# Patient Record
Sex: Female | Born: 2000 | Race: White | Hispanic: No | Marital: Single | Smoking: Never smoker
Health system: Southern US, Community
[De-identification: ages and names within clinical notes are randomized; demographics above are authoritative.]

## PROBLEM LIST (undated history)

## (undated) DIAGNOSIS — G4700 Insomnia, unspecified: Secondary | ICD-10-CM

## (undated) DIAGNOSIS — F909 Attention-deficit hyperactivity disorder, unspecified type: Secondary | ICD-10-CM

## (undated) HISTORY — PX: OTHER SURGICAL HISTORY: SHX169

---
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
## 2018-09-17 NOTE — L&D Delivery Note (Addendum)
[facility]N [facility] Delivery Note  Helen Sims is a 18 y.o. G1P0 s/p vaginal Delivery at [redacted]w[redacted]d. She was admitted for IOL for gHTN.   ROM: 14h 41m with fluid GBS Status: GBS positive, PCN Maximum Maternal Temperature: 99.7  Labor Progress: . Achieved complete cervical dilation at 1542 after Pitocin augmentation.  No further augmentation required and progressed to vaginal delivery.    Delivery Date/Time: 08/11/2019@ 1633 Delivery: Called to room and patient was complete and pushing. Head delivered ROA. No nuchal cord present. Shoulder and body delivered in usual fashion. Infant with spontaneous cry, placed on mother's abdomen, dried and stimulated. Short placental cord which was clamped x 2 after 1-minute delay, and cut by FOB. Cord blood drawn. Placenta delivered by manual extraction after 32 mins and sent to Pathology. Fundus firm with massage and Pitocin. Labia, perineum, vagina, and cervix inspected and noted to have Rt sulcus and Lt labial tear both repaired with 2.0 Vicryl Rapide and hemostasis achieved.   Placenta: 3 cord vessel, retained with manual extraction by Colman Cater CNM and intact sent to Pathology Complications: Retained placenta with manual extraction by Colman Cater CNM, Ancef x1 ordered Lacerations: Rt Sulcus and Lt labia QBL: 393 Analgesia: Epidural  Postpartum Planning [x]  message to sent to schedule follow-up  [x]  vaccines UTD  Infant: female  APGARs 8/9  [place] for [place], [place] 08/11/2019, 5:30 PM  Midwife attestation: I was gloved and present for delivery in its entirety and I agree with the above resident's note.  Julianne Handler, CNM 7:37 PM

## 2018-12-15 ENCOUNTER — Telehealth: Payer: Self-pay | Admitting: *Deleted

## 2018-12-15 NOTE — Telephone Encounter (Signed)
Pt states that she has had 5 home pregnancy tests. Her LMP was 11/15/18 which puts her at 4+2. Advised that she would have a televisit with a provider, and that it would be billed to her insurance. Advised that the provider would let her know the plan of action after that visit and schedule a dating u/s. Pt verbalized understanding.

## 2018-12-15 NOTE — Telephone Encounter (Signed)
Patient's mother called wanting to schedule patient an appointment for pregnancy. Mom states the patient has took 5 home test and they are all positive. Please advise [phone]

## 2018-12-23 ENCOUNTER — Encounter: Payer: Medicaid Other | Admitting: Adult Health

## 2019-01-28 ENCOUNTER — Encounter: Payer: Self-pay | Admitting: Adult Health

## 2019-01-28 ENCOUNTER — Ambulatory Visit (INDEPENDENT_AMBULATORY_CARE_PROVIDER_SITE_OTHER): Payer: 59 | Admitting: Adult Health

## 2019-01-28 ENCOUNTER — Other Ambulatory Visit: Payer: Self-pay

## 2019-01-28 DIAGNOSIS — O3680X0 Pregnancy with inconclusive fetal viability, not applicable or unspecified: Secondary | ICD-10-CM | POA: Insufficient documentation

## 2019-01-28 DIAGNOSIS — Z3201 Encounter for pregnancy test, result positive: Secondary | ICD-10-CM | POA: Insufficient documentation

## 2019-01-28 DIAGNOSIS — Z3A10 10 weeks gestation of pregnancy: Secondary | ICD-10-CM

## 2019-01-28 MED ORDER — PRENATAL VITAMIN 27-0.8 MG PO TABS: 1.0000 | ORAL_TABLET | Freq: Every day | ORAL | 12 refills | Status: DC

## 2019-01-28 NOTE — Progress Notes (Signed)
Patient ID: Helen Sims, female   DOB: 22-Apr-2001, 18 y.o.   MRN: [id number]   TELEHEALTH VIRTUAL GYNECOLOGY VISIT ENCOUNTER NOTE  I connected with Helen Sims on 01/28/19 at 12:00 PM EDT by telephone at home and verified that I am speaking with the correct person using two identifiers.   I discussed the limitations, risks, security and privacy concerns of performing an evaluation and management service by telephone and the availability of in person appointments. I also discussed with the patient that there may be a patient responsible charge related to this service. The patient expressed understanding and agreed to proceed.   History:  Helen Sims is a 18 y.o. G1P0 white female,single, being evaluated today for having missed 2 periods and had 5+HPTs.she is about 10+4 weeks by LMP 11/15/18, with EDD 08/22/2019.She has had breat tenderness, moody, sleeping and pee more often. She denies any abnormal vaginal discharge, bleeding, pelvic pain,nausea, vomiting or other concerns.    PCP is Dr Helen Sims.    Past Medical History:  Diagnosis Date  . ADHD (attention deficit hyperactivity disorder)   . Insomnia    Past Surgical History:  Procedure Laterality Date  . tubes in ears     The following portions of the patient's history were reviewed and updated as appropriate: allergies, current medications, past family history, past medical history, past social history, past surgical history and problem list.   Health Maintenance: pap at 19  Review of Systems:  Pertinent items noted in HPI and remainder of comprehensive ROS otherwise negative.  Physical Exam:   General:  Alert, oriented and cooperative.   Mental Status: Normal mood and affect perceived. Normal judgment and thought content.  Physical exam deferred due to nature of the encounter  Labs and Imaging No results found for this or any previous visit (from the past 336 hour(s)). No results found.    Assessment and Plan:     1.  Positive pregnancy test -5HPTs  2. [redacted] weeks gestation of pregnancy Will rx PNV and schedule dating [place] in 1 week  Meds ordered this encounter  Medications  . Prenatal Vit-Fe Fumarate-FA (PRENATAL VITAMIN) 27-0.8 MG TABS    Sig: Take 1 tablet by mouth daily.    Dispense:  30 tablet    Refill:  12    Order Specific Question:   Supervising Provider    Answer:   Despina Hidden, LUTHER H [[id number]]    3. Encounter to determine fetal viability of pregnancy, single or unspecified fetus - US OB Comp Less 14 Wks; Future       I discussed the assessment and treatment plan with the patient. The patient was provided an opportunity to ask questions and all were answered. The patient agreed with the plan and demonstrated an understanding of the instructions.   The patient was advised to call back or seek an in-person evaluation/go to the ED if the symptoms worsen or if the condition fails to improve as anticipated.  I provided  7 minutes of non-face-to-face time during this encounter.   Cyril Mourning, NP [facility] [employer]

## 2019-01-31 DIAGNOSIS — Z87891 Personal history of nicotine dependence: Secondary | ICD-10-CM | POA: Diagnosis not present

## 2019-01-31 DIAGNOSIS — O209 Hemorrhage in early pregnancy, unspecified: Secondary | ICD-10-CM | POA: Diagnosis not present

## 2019-01-31 DIAGNOSIS — Z3A10 10 weeks gestation of pregnancy: Secondary | ICD-10-CM | POA: Diagnosis not present

## 2019-02-04 ENCOUNTER — Ambulatory Visit (INDEPENDENT_AMBULATORY_CARE_PROVIDER_SITE_OTHER): Payer: 59

## 2019-02-04 ENCOUNTER — Other Ambulatory Visit: Payer: Self-pay

## 2019-02-04 DIAGNOSIS — O3680X0 Pregnancy with inconclusive fetal viability, not applicable or unspecified: Secondary | ICD-10-CM

## 2019-02-04 DIAGNOSIS — Z3A11 11 weeks gestation of pregnancy: Secondary | ICD-10-CM

## 2019-02-04 NOTE — Progress Notes (Signed)
[place] 11+4 wks,single IUP,CRL 51.44 mm,fhr 166 bpm,anterior placenta gr 0,normal ovaries bilat

## 2019-02-13 ENCOUNTER — Other Ambulatory Visit: Payer: Self-pay | Admitting: Obstetrics and Gynecology

## 2019-02-13 DIAGNOSIS — Z3682 Encounter for antenatal screening for nuchal translucency: Secondary | ICD-10-CM

## 2019-02-16 ENCOUNTER — Telehealth: Payer: Self-pay | Admitting: *Deleted

## 2019-02-16 NOTE — Telephone Encounter (Signed)
Patient informed we are still not allowing any visitors or children to come in during appointment time unless physical assistance is needed. Asked if has had any exposure to anyone suspected or confirmed of having COVID-19 or if she was experiencing any of the following, to reschedule: fever, cough, shortness of breath, muscle pain, diarrhea, rash, vomiting, abdominal pain, red eye, weakness, bruising, bleeding, joint pain, or a severe headache.  Stated no to all.  Asked that she complete E-check-in via mychart prior to arrival.  Advised to check-in via Hello Patient and call our office on arrival in our office parking lot to complete registration over the phone. Advised to also use the provided hand sanitizer when entering the office and to wear a mask if they have one of their own, if not, we are happy to provide one. Pt verbalized understanding.      

## 2019-02-17 ENCOUNTER — Other Ambulatory Visit: Payer: 59

## 2019-02-17 ENCOUNTER — Ambulatory Visit: Payer: 59 | Admitting: *Deleted

## 2019-02-17 ENCOUNTER — Encounter: Payer: 59 | Admitting: Women's Health

## 2019-03-09 ENCOUNTER — Telehealth: Payer: Self-pay | Admitting: Women's Health

## 2019-03-09 NOTE — Telephone Encounter (Signed)

## 2019-03-11 ENCOUNTER — Ambulatory Visit: Payer: 59 | Admitting: *Deleted

## 2019-03-11 ENCOUNTER — Ambulatory Visit (INDEPENDENT_AMBULATORY_CARE_PROVIDER_SITE_OTHER): Payer: 59 | Admitting: Women's Health

## 2019-03-11 ENCOUNTER — Encounter: Payer: Self-pay | Admitting: Women's Health

## 2019-03-11 ENCOUNTER — Other Ambulatory Visit: Payer: 59

## 2019-03-11 ENCOUNTER — Other Ambulatory Visit: Payer: Self-pay

## 2019-03-11 VITALS — BP 126/74 | HR 98 | Wt 220.0 lb

## 2019-03-11 DIAGNOSIS — Z3402 Encounter for supervision of normal first pregnancy, second trimester: Secondary | ICD-10-CM | POA: Diagnosis not present

## 2019-03-11 DIAGNOSIS — Z3400 Encounter for supervision of normal first pregnancy, unspecified trimester: Secondary | ICD-10-CM | POA: Insufficient documentation

## 2019-03-11 DIAGNOSIS — Z3A16 16 weeks gestation of pregnancy: Secondary | ICD-10-CM | POA: Diagnosis not present

## 2019-03-11 DIAGNOSIS — Z1389 Encounter for screening for other disorder: Secondary | ICD-10-CM

## 2019-03-11 DIAGNOSIS — Z363 Encounter for antenatal screening for malformations: Secondary | ICD-10-CM

## 2019-03-11 DIAGNOSIS — Z331 Pregnant state, incidental: Secondary | ICD-10-CM

## 2019-03-11 LAB — POCT URINALYSIS DIPSTICK OB
Blood, UA: NEGATIVE
Glucose, UA: NEGATIVE
Ketones, UA: NEGATIVE
Leukocytes, UA: NEGATIVE
Nitrite, UA: NEGATIVE
POC,PROTEIN,UA: NEGATIVE

## 2019-03-11 MED ORDER — BLOOD PRESSURE MONITOR MISC: 0 refills | Status: DC

## 2019-03-11 NOTE — Patient Instructions (Signed)
Helen Sims, I greatly value your feedback.  If you receive a survey following your visit with [place] today, we appreciate you taking the time to fill it out.  Thanks, Knute Neu, CNM, [facility]  [place]!!! It is now [place] at [facility] ([place], [place] [address]) Entrance located off of [address] parking   Nausea & Vomiting  Have saltine crackers or pretzels by your bed and eat a few bites before you raise your head out of bed in the morning  Eat small frequent meals throughout the day instead of large meals  Drink plenty of fluids throughout the day to stay hydrated, just don't drink a lot of fluids with your meals.  This can make your stomach fill up faster making you feel sick  Do not brush your teeth right after you eat  Products with real ginger are good for nausea, like ginger ale and ginger hard candy Make sure it says made with real ginger!  Sucking on sour candy like lemon heads is also good for nausea  If your prenatal vitamins make you nauseated, take them at night so you will sleep through the nausea  Sea Bands  If you feel like you need medicine for the nausea & vomiting please let us know  If you are unable to keep any fluids or food down please let us know   Constipation  Drink plenty of fluid, preferably water, throughout the day  Eat foods high in fiber such as fruits, vegetables, and grains  Exercise, such as walking, is a good way to keep your bowels regular  Drink warm fluids, especially warm prune juice, or decaf coffee  Eat a 1/2 cup of real oatmeal (not instant), 1/2 cup applesauce, and 1/2-1 cup warm prune juice every day  If needed, you may take Colace (docusate sodium) stool softener once or twice a day to help keep the stool soft.   If you still are having problems with constipation, you may take Miralax once daily as needed to help keep your bowels regular.   Home Blood  Pressure Monitoring for Patients   Your provider has recommended that you check your blood pressure (BP) at least once a week at home. If you do not have a blood pressure cuff at home, one will be provided for you. Contact your provider if you have not received your monitor within 1 week.   Helpful Tips for Accurate Home Blood Pressure Checks  . Don't smoke, exercise, or drink caffeine 30 minutes before checking your BP . Use the restroom before checking your BP (a full bladder can raise your pressure) . Relax in a comfortable upright chair . Feet on the ground . Left arm resting comfortably on a flat surface at the level of your heart . Legs uncrossed . Back supported . Sit quietly and don't talk . Place the cuff on your bare arm . Adjust snuggly, so that only two fingertips can fit between your skin and the top of the cuff . Check 2 readings separated by at least one minute . Keep a log of your BP readings . For a visual, please reference this diagram: http://ccnc.care/bpdiagram  Provider Name: [facility]     Phone: [phone]  Zone 1: ALL CLEAR  Continue to monitor your symptoms:  . BP reading is less than 140 (top number) or less than 90 (bottom number)  . No right upper stomach  pain . No headaches or seeing spots . No feeling nauseated or throwing up . No swelling in face and hands  Zone 2: CAUTION Call your doctor's office for any of the following:  . BP reading is greater than 140 (top number) or greater than 90 (bottom number)  . Stomach pain under your ribs in the middle or right side . Headaches or seeing spots . Feeling nauseated or throwing up . Swelling in face and hands  Zone 3: EMERGENCY  Seek immediate medical care if you have any of the following:  . BP reading is greater than160 (top number) or greater than 110 (bottom number) . Severe headaches not improving with Tylenol . Serious difficulty catching your breath . Any worsening symptoms from Zone  2    Second Trimester of Pregnancy The second trimester is from week 14 through week 27 (months 4 through 6). The second trimester is often a time when you feel your best. Your body has adjusted to being pregnant, and you begin to feel better physically. Usually, morning sickness has lessened or quit completely, you may have more energy, and you may have an increase in appetite. The second trimester is also a time when the fetus is growing rapidly. At the end of the sixth month, the fetus is about 9 inches long and weighs about 1 pounds. You will likely begin to feel the baby move (quickening) between 16 and 20 weeks of pregnancy. Body changes during your second trimester Your body continues to go through many changes during your second trimester. The changes vary from woman to woman.  Your weight will continue to increase. You will notice your lower abdomen bulging out.  You may begin to get stretch marks on your hips, abdomen, and breasts.  You may develop headaches that can be relieved by medicines. The medicines should be approved by your health care provider.  You may urinate more often because the fetus is pressing on your bladder.  You may develop or continue to have heartburn as a result of your pregnancy.  You may develop constipation because certain hormones are causing the muscles that push waste through your intestines to slow down.  You may develop hemorrhoids or swollen, bulging veins (varicose veins).  You may have back pain. This is caused by: ? Weight gain. ? Pregnancy hormones that are relaxing the joints in your pelvis. ? A shift in weight and the muscles that support your balance.  Your breasts will continue to grow and they will continue to become tender.  Your gums may bleed and may be sensitive to brushing and flossing.  Dark spots or blotches (chloasma, mask of pregnancy) may develop on your face. This will likely fade after the baby is born.  A dark line  from your belly button to the pubic area (linea nigra) may appear. This will likely fade after the baby is born.  You may have changes in your hair. These can include thickening of your hair, rapid growth, and changes in texture. Some women also have hair loss during or after pregnancy, or hair that feels dry or thin. Your hair will most likely return to normal after your baby is born. What to expect at prenatal visits During a routine prenatal visit:  You will be weighed to make sure you and the fetus are growing normally.  Your blood pressure will be taken.  Your abdomen will be measured to track your baby's growth.  The fetal heartbeat will be listened to.  Any test results from the previous visit will be discussed. Your health care provider may ask you:  How you are feeling.  If you are feeling the baby move.  If you have had any abnormal symptoms, such as leaking fluid, bleeding, severe headaches, or abdominal cramping.  If you are using any tobacco products, including cigarettes, chewing tobacco, and electronic cigarettes.  If you have any questions. Other tests that may be performed during your second trimester include:  Blood tests that check for: ? Low iron levels (anemia). ? High blood sugar that affects pregnant women (gestational diabetes) between 2 and 28 weeks. ? Rh antibodies. This is to check for a protein on red blood cells (Rh factor).  Urine tests to check for infections, diabetes, or protein in the urine.  An ultrasound to confirm the proper growth and development of the baby.  An amniocentesis to check for possible genetic problems.  Fetal screens for spina bifida and Down syndrome.  HIV (human immunodeficiency virus) testing. Routine prenatal testing includes screening for HIV, unless you choose not to have this test. Follow these instructions at home: Medicines  Follow your health care provider's instructions regarding medicine use. Specific  medicines may be either safe or unsafe to take during pregnancy.  Take a prenatal vitamin that contains at least 600 micrograms (mcg) of folic acid.  If you develop constipation, try taking a stool softener if your health care provider approves. Eating and drinking   Eat a balanced diet that includes fresh fruits and vegetables, whole grains, good sources of protein such as meat, eggs, or tofu, and low-fat dairy. Your health care provider will help you determine the amount of weight gain that is right for you.  Avoid raw meat and uncooked cheese. These carry germs that can cause birth defects in the baby.  If you have low calcium intake from food, talk to your health care provider about whether you should take a daily calcium supplement.  Limit foods that are high in fat and processed sugars, such as fried and sweet foods.  To prevent constipation: ? Drink enough fluid to keep your urine clear or pale yellow. ? Eat foods that are high in fiber, such as fresh fruits and vegetables, whole grains, and beans. Activity  Exercise only as directed by your health care provider. Most women can continue their usual exercise routine during pregnancy. Try to exercise for 30 minutes at least 5 days a week. Stop exercising if you experience uterine contractions.  Avoid heavy lifting, wear low heel shoes, and practice good posture.  A sexual relationship may be continued unless your health care provider directs you otherwise. Relieving pain and discomfort  Wear a good support bra to prevent discomfort from breast tenderness.  Take warm sitz baths to soothe any pain or discomfort caused by hemorrhoids. Use hemorrhoid cream if your health care provider approves.  Rest with your legs elevated if you have leg cramps or low back pain.  If you develop varicose veins, wear support hose. Elevate your feet for 15 minutes, 3-4 times a day. Limit salt in your diet. Prenatal Care  Write down your  questions. Take them to your prenatal visits.  Keep all your prenatal visits as told by your health care provider. This is important. Safety  Wear your seat belt at all times when driving.  Make a list of emergency phone numbers, including numbers for family, friends, the hospital, and police and fire departments. General instructions  Ask your health  care provider for a referral to a local prenatal education class. Begin classes no later than the beginning of month 6 of your pregnancy.  Ask for help if you have counseling or nutritional needs during pregnancy. Your health care provider can offer advice or refer you to specialists for help with various needs.  Do not use hot tubs, steam rooms, or saunas.  Do not douche or use tampons or scented sanitary pads.  Do not cross your legs for long periods of time.  Avoid cat litter boxes and soil used by cats. These carry germs that can cause birth defects in the baby and possibly loss of the fetus by miscarriage or stillbirth.  Avoid all smoking, herbs, alcohol, and unprescribed drugs. Chemicals in these products can affect the formation and growth of the baby.  Do not use any products that contain nicotine or tobacco, such as cigarettes and e-cigarettes. If you need help quitting, ask your health care provider.  Visit your dentist if you have not gone yet during your pregnancy. Use a soft toothbrush to brush your teeth and be gentle when you floss. Contact a health care provider if:  You have dizziness.  You have mild pelvic cramps, pelvic pressure, or nagging pain in the abdominal area.  You have persistent nausea, vomiting, or diarrhea.  You have a bad smelling vaginal discharge.  You have pain when you urinate. Get help right away if:  You have a fever.  You are leaking fluid from your vagina.  You have spotting or bleeding from your vagina.  You have severe abdominal cramping or pain.  You have rapid weight gain or  weight loss.  You have shortness of breath with chest pain.  You notice sudden or extreme swelling of your face, hands, ankles, feet, or legs.  You have not felt your baby move in over an hour.  You have severe headaches that do not go away when you take medicine.  You have vision changes. Summary  The second trimester is from week 14 through week 27 (months 4 through 6). It is also a time when the fetus is growing rapidly.  Your body goes through many changes during pregnancy. The changes vary from woman to woman.  Avoid all smoking, herbs, alcohol, and unprescribed drugs. These chemicals affect the formation and growth your baby.  Do not use any tobacco products, such as cigarettes, chewing tobacco, and e-cigarettes. If you need help quitting, ask your health care provider.  Contact your health care provider if you have any questions. Keep all prenatal visits as told by your health care provider. This is important. This information is not intended to replace advice given to you by your health care provider. Make sure you discuss any questions you have with your health care provider. Document Released: 08/28/2001 Document Revised: 10/09/2016 Document Reviewed: 10/09/2016 Elsevier Interactive Patient Education  2019 [employer] Coronavirus (COVID-19) Are you at risk?  Are you at risk for the Coronavirus (COVID-19)?  To be considered HIGH RISK for Coronavirus (COVID-19), you have to meet the following criteria:  . Traveled to [place], [place], [place], [place] or [place]; or in the [place] to [place], [place], [place], or [place]; and have fever, cough, and shortness of breath within the last 2 weeks of travel OR . Been in close contact with a person diagnosed with COVID-19 within the last 2 weeks and have fever, cough, and shortness of breath . IF YOU DO NOT MEET THESE CRITERIA,  YOU ARE CONSIDERED LOW RISK FOR COVID-19.  What to do if you are HIGH RISK for COVID-19?   Marland Kitchen. If you are having a medical emergency, call 911. . Seek medical care right away. Before you go to a doctor's office, urgent care or emergency department, call ahead and tell them about your recent travel, contact with someone diagnosed with COVID-19, and your symptoms. You should receive instructions from your physician's office regarding next steps of care.  . When you arrive at healthcare provider, tell the healthcare staff immediately you have returned from visiting [place], [place], [place], [place] or [place]; or traveled in the [place] to [place], [place], [place], or [place]; in the last two weeks or you have been in close contact with a person diagnosed with COVID-19 in the last 2 weeks.   . Tell the health care staff about your symptoms: fever, cough and shortness of breath. . After you have been seen by a medical provider, you will be either: o Tested for (COVID-19) and discharged home on quarantine except to seek medical care if symptoms worsen, and asked to  - Stay home and avoid contact with others until you get your results (4-5 days)  - Avoid travel on public transportation if possible (such as bus, train, or airplane) or o Sent to the Emergency Department by EMS for evaluation, COVID-19 testing, and possible admission depending on your condition and test results.  What to do if you are LOW RISK for COVID-19?  Reduce your risk of any infection by using the same precautions used for avoiding the common cold or flu:  Marland Kitchen. Wash your hands often with soap and warm water for at least 20 seconds.  If soap and water are not readily available, use an alcohol-based hand sanitizer with at least 60% alcohol.  . If coughing or sneezing, cover your mouth and nose by coughing or sneezing into the elbow areas of your shirt or coat, into a tissue or into your sleeve (not your hands). . Avoid shaking hands with others and consider head nods or verbal greetings only. . Avoid touching your  eyes, nose, or mouth with unwashed hands.  . Avoid close contact with people who are sick. . Avoid places or events with large numbers of people in one location, like concerts or sporting events. . Carefully consider travel plans you have or are making. . If you are planning any travel outside or inside the [place], visit the CDC's Travelers' Health webpage for the latest health notices. . If you have some symptoms but not all symptoms, continue to monitor at home and seek medical attention if your symptoms worsen. . If you are having a medical emergency, call 911.   ADDITIONAL HEALTHCARE OPTIONS FOR PATIENTS  [place] [facility] / e-Visit: [email or web address]         [facility]: [phone]  [facility]: [phone]                   [facility]: [phone]

## 2019-03-11 NOTE — Progress Notes (Signed)
INITIAL OBSTETRICAL VISIT Patient name: Helen Sims MRN [id number]  Date of birth: 2001-07-29 Chief Complaint:   Initial Prenatal Visit  History of Present Illness:   Helen Sims is a 18 y.o. G90P0 Caucasian female at [redacted]w[redacted]d by LMP c/w 11wk u/s, with an Estimated Date of Delivery: 08/22/19 being seen today for her initial obstetrical visit.   Her obstetrical history is significant for primigravida.   Today she reports no complaints.  Patient's last menstrual period was 11/15/2018 (exact date). Last pap <21yo. Results were: n/a Review of Systems:   Pertinent items are noted in HPI Denies cramping/contractions, leakage of fluid, vaginal bleeding, abnormal vaginal discharge w/ itching/odor/irritation, headaches, visual changes, shortness of breath, chest pain, abdominal pain, severe nausea/vomiting, or problems with urination or bowel movements unless otherwise stated above.  Pertinent History Reviewed:  Reviewed past medical,surgical, social, obstetrical and family history.  Reviewed problem list, medications and allergies. OB History  Gravida Para Term Preterm AB Living  1            SAB TAB Ectopic Multiple Live Births               # Outcome Date GA Lbr Len/2nd Weight Sex Delivery Anes PTL Lv  1 Current            Physical Assessment:   Vitals:   03/11/19 1526  BP: 126/74  Pulse: 98  Weight: 220 lb (99.8 kg)  Body mass index is 38.97 kg/m.       Physical Examination:  General appearance - well appearing, and in no distress  Mental status - alert, oriented to person, place, and time  Psych:  She has a normal mood and affect  Skin - warm and dry, normal color, no suspicious lesions noted  Chest - effort normal, all lung fields clear to auscultation bilaterally  Heart - normal rate and regular rhythm  Abdomen - soft, nontender  Extremities:  No swelling or varicosities noted  Thin prep pap is not done   FHR: 159 via doppler  Results for orders placed or performed  in visit on 03/11/19 (from the past 24 hour(s))  POC Urinalysis Dipstick OB   Collection Time: 03/11/19  3:49 PM  Result Value Ref Range   Color, UA     Clarity, UA     Glucose, UA Negative Negative   Bilirubin, UA     Ketones, UA neg    Spec Grav, UA     Blood, UA neg    pH, UA     POC,PROTEIN,UA Negative Negative, Trace, Small (1+), Moderate (2+), Large (3+), 4+   Urobilinogen, UA     Nitrite, UA neg    Leukocytes, UA Negative Negative   Appearance     Odor      Assessment & Plan:  1) Low-Risk Pregnancy G1P0 at [redacted]w[redacted]d with an Estimated Date of Delivery: 08/22/19   2) Initial OB visit  Meds:  Meds ordered this encounter  Medications  . Blood Pressure Monitor MISC    Sig: For regular home bp monitoring during pregnancy    Dispense:  1 each    Refill:  0    Z34.90    Initial labs obtained Continue prenatal vitamins Reviewed n/v relief measures and warning s/s to report Reviewed recommended weight gain based on pre-gravid BMI Encouraged well-balanced diet Genetic Screening discussed: declined Cystic fibrosis, SMA, Fragile X screening discussed declined Ultrasound discussed; fetal survey: requested CCNC completed>PCM not here, form faxed Does not  have home bp cuff. [facility]. Check bp weekly, let us know if >140/90.   Follow-up: Return in about 2 weeks (around 03/25/2019) for LROB, in person, WU:[id number]:Anatomy.   Orders Placed This Encounter  Procedures  . GC/Chlamydia Probe Amp  . Urine Culture  . US OB Comp + 14 Wk  . Obstetric Panel, Including HIV  . Urinalysis, Routine w reflex microscopic  . Sickle cell screen  . Pain Management Screening Profile (10S)  . POC Urinalysis Dipstick OB    Cheral MarkerKimberly R Meranda Dechaine CNM, [facility]NP-BC 03/11/2019 4:30 PM

## 2019-03-12 LAB — OBSTETRIC PANEL, INCLUDING HIV
Antibody Screen: NEGATIVE
Basophils Absolute: 0.1 10*3/uL (ref 0.0–0.2)
Basos: 0 %
EOS (ABSOLUTE): 0.5 10*3/uL — ABNORMAL HIGH (ref 0.0–0.4)
Eos: 4 %
HIV Screen 4th Generation wRfx: NONREACTIVE
Hematocrit: 35.5 % (ref 34.0–46.6)
Hemoglobin: 11.6 g/dL (ref 11.1–15.9)
Hepatitis B Surface Ag: NEGATIVE
Immature Grans (Abs): 0 10*3/uL (ref 0.0–0.1)
Immature Granulocytes: 0 %
Lymphocytes Absolute: 2.8 10*3/uL (ref 0.7–3.1)
Lymphs: 22 %
MCH: 27.1 pg (ref 26.6–33.0)
MCHC: 32.7 g/dL (ref 31.5–35.7)
MCV: 83 fL (ref 79–97)
Monocytes Absolute: 0.9 10*3/uL (ref 0.1–0.9)
Monocytes: 7 %
Neutrophils Absolute: 8.2 10*3/uL — ABNORMAL HIGH (ref 1.4–7.0)
Neutrophils: 67 %
Platelets: 309 10*3/uL (ref 150–450)
RBC: 4.28 x10E6/uL (ref 3.77–5.28)
RDW: 14 % (ref 11.7–15.4)
RPR Ser Ql: NONREACTIVE
Rh Factor: POSITIVE
Rubella Antibodies, IGG: 3.85 index (ref >0.99)
WBC: 12.6 10*3/uL — ABNORMAL HIGH (ref 3.4–10.8)

## 2019-03-12 LAB — URINALYSIS, ROUTINE W REFLEX MICROSCOPIC
Bilirubin, UA: NEGATIVE
Glucose, UA: NEGATIVE
Ketones, UA: NEGATIVE
Nitrite, UA: NEGATIVE
Protein,UA: NEGATIVE
RBC, UA: NEGATIVE
Specific Gravity, UA: 1.027 (ref 1.005–1.030)
Urobilinogen, Ur: 0.2 mg/dL (ref 0.2–1.0)
pH, UA: 5.5 (ref 5.0–7.5)

## 2019-03-12 LAB — PMP SCREEN PROFILE (10S), URINE
Amphetamine Scrn, Ur: NEGATIVE ng/mL
BARBITURATE SCREEN URINE: NEGATIVE ng/mL
BENZODIAZEPINE SCREEN, URINE: NEGATIVE ng/mL
CANNABINOIDS UR QL SCN: NEGATIVE ng/mL
Cocaine (Metab) Scrn, Ur: NEGATIVE ng/mL
Creatinine(Crt), U: 206.2 mg/dL (ref 20.0–300.0)
Methadone Screen, Urine: NEGATIVE ng/mL
OXYCODONE+OXYMORPHONE UR QL SCN: NEGATIVE ng/mL
Opiate Scrn, Ur: NEGATIVE ng/mL
Ph of Urine: 5.6 (ref 4.5–8.9)
Phencyclidine Qn, Ur: NEGATIVE ng/mL
Propoxyphene Scrn, Ur: NEGATIVE ng/mL

## 2019-03-12 LAB — MICROSCOPIC EXAMINATION: Casts: NONE SEEN /lpf

## 2019-03-12 LAB — SICKLE CELL SCREEN: Sickle Cell Screen: NEGATIVE

## 2019-03-12 LAB — MED LIST OPTION NOT SELECTED

## 2019-03-13 LAB — URINE CULTURE

## 2019-03-13 LAB — GC/CHLAMYDIA PROBE AMP
Chlamydia trachomatis, NAA: NEGATIVE
Neisseria Gonorrhoeae by PCR: NEGATIVE

## 2019-03-26 ENCOUNTER — Other Ambulatory Visit: Payer: Self-pay

## 2019-03-26 ENCOUNTER — Ambulatory Visit (INDEPENDENT_AMBULATORY_CARE_PROVIDER_SITE_OTHER): Payer: 59

## 2019-03-26 ENCOUNTER — Encounter: Payer: Self-pay | Admitting: Women's Health

## 2019-03-26 ENCOUNTER — Ambulatory Visit (INDEPENDENT_AMBULATORY_CARE_PROVIDER_SITE_OTHER): Payer: 59 | Admitting: Women's Health

## 2019-03-26 VITALS — BP 128/80 | HR 102 | Wt 224.0 lb

## 2019-03-26 DIAGNOSIS — Z1389 Encounter for screening for other disorder: Secondary | ICD-10-CM

## 2019-03-26 DIAGNOSIS — Z3402 Encounter for supervision of normal first pregnancy, second trimester: Secondary | ICD-10-CM

## 2019-03-26 DIAGNOSIS — Z3A18 18 weeks gestation of pregnancy: Secondary | ICD-10-CM

## 2019-03-26 DIAGNOSIS — Z363 Encounter for antenatal screening for malformations: Secondary | ICD-10-CM | POA: Diagnosis not present

## 2019-03-26 DIAGNOSIS — Z331 Pregnant state, incidental: Secondary | ICD-10-CM

## 2019-03-26 LAB — POCT URINALYSIS DIPSTICK OB
Blood, UA: NEGATIVE
Glucose, UA: NEGATIVE
Ketones, UA: NEGATIVE
Nitrite, UA: NEGATIVE
POC,PROTEIN,UA: NEGATIVE

## 2019-03-26 NOTE — Progress Notes (Signed)
[place] 18+5 wks,breech,anterior placenta gr 0,cx 4.2 cm,svp of fluid 4.5 cm,fhr 152 bpm,normal ovaries bilat,efw 271 g 64%,anatomy complete,no obvious abnormalities

## 2019-03-26 NOTE — Patient Instructions (Addendum)
Helen Sims, I greatly value your feedback.  If you receive a survey following your visit with [place] today, we appreciate you taking the time to fill it out.  Thanks, Knute Neu, CNM, [facility]  [place]!!! It is now [name] [facility] ([place], [place] [address]) Entrance located off of [address] parking   [place] Pediatricians/Family Doctors:  [place]            [facility] [phone]                 [place] [phone] (usually not accepting new patients unless you have family there already, you are always welcome to call and ask)       [place] Department [phone]       [facility] Pediatricians/Family Doctors:   [facility]: [phone]  [facility]: [phone]  [facility]: [place] Doctors:   [facility]: [place]: [place]:  [place]: [phone]    Home Blood Pressure Monitoring for Patients   Your provider has recommended that you check your blood pressure (BP) at least once a week at home. If you do not have a blood pressure cuff at home, one will be provided for you. Contact your provider if you have not received your monitor within 1 week.   Helpful Tips for Accurate Home Blood Pressure Checks  . Don't smoke, exercise, or drink caffeine 30 minutes before checking your BP . Use the restroom before checking your BP (a full bladder can raise your pressure) . Relax in a comfortable upright chair . Feet on the ground . Left arm resting comfortably on a flat surface at the level of your heart . Legs uncrossed . Back supported . Sit quietly and don't talk . Place the cuff on your bare arm . Adjust snuggly, so that only two fingertips can fit  between your skin and the top of the cuff . Check 2 readings separated by at least one minute . Keep a log of your BP readings . For a visual, please reference this diagram: http://ccnc.care/bpdiagram  Provider Name: [facility]     Phone: [phone]  Zone 1: ALL CLEAR  Continue to monitor your symptoms:  . BP reading is less than 140 (top number) or less than 90 (bottom number)  . No right upper stomach pain . No headaches or seeing spots . No feeling nauseated or throwing up . No swelling in face and hands  Zone 2: CAUTION Call your doctor's office for any of the following:  . BP reading is greater than 140 (top number) or greater than 90 (bottom number)  . Stomach pain under your ribs in the middle or right side . Headaches or seeing spots . Feeling nauseated or throwing up . Swelling in face and hands  Zone 3: EMERGENCY  Seek immediate medical care if you have any of the following:  . BP reading is greater than160 (top number) or greater than 110 (bottom number) . Severe headaches not improving with Tylenol . Serious difficulty catching your breath . Any worsening symptoms from Zone 2     Second Trimester of Pregnancy The second trimester is from week 14 through week 27 (months 4 through 6). The second trimester is often a time when you feel your best. Your body has  adjusted to being pregnant, and you begin to feel better physically. Usually, morning sickness has lessened or quit completely, you may have more energy, and you may have an increase in appetite. The second trimester is also a time when the fetus is growing rapidly. At the end of the sixth month, the fetus is about 9 inches long and weighs about 1 pounds. You will likely begin to feel the baby move (quickening) between 16 and 20 weeks of pregnancy. Body changes during your second trimester Your body continues to go through many changes during your second trimester. The changes vary from woman to woman.   Your weight will continue to increase. You will notice your lower abdomen bulging out.  You may begin to get stretch marks on your hips, abdomen, and breasts.  You may develop headaches that can be relieved by medicines. The medicines should be approved by your health care provider.  You may urinate more often because the fetus is pressing on your bladder.  You may develop or continue to have heartburn as a result of your pregnancy.  You may develop constipation because certain hormones are causing the muscles that push waste through your intestines to slow down.  You may develop hemorrhoids or swollen, bulging veins (varicose veins).  You may have back pain. This is caused by: ? Weight gain. ? Pregnancy hormones that are relaxing the joints in your pelvis. ? A shift in weight and the muscles that support your balance.  Your breasts will continue to grow and they will continue to become tender.  Your gums may bleed and may be sensitive to brushing and flossing.  Dark spots or blotches (chloasma, mask of pregnancy) may develop on your face. This will likely fade after the baby is born.  A dark line from your belly button to the pubic area (linea nigra) may appear. This will likely fade after the baby is born.  You may have changes in your hair. These can include thickening of your hair, rapid growth, and changes in texture. Some women also have hair loss during or after pregnancy, or hair that feels dry or thin. Your hair will most likely return to normal after your baby is born.  What to expect at prenatal visits During a routine prenatal visit:  You will be weighed to make sure you and the fetus are growing normally.  Your blood pressure will be taken.  Your abdomen will be measured to track your baby's growth.  The fetal heartbeat will be listened to.  Any test results from the previous visit will be discussed.  Your health care provider may ask you:  How you are  feeling.  If you are feeling the baby move.  If you have had any abnormal symptoms, such as leaking fluid, bleeding, severe headaches, or abdominal cramping.  If you are using any tobacco products, including cigarettes, chewing tobacco, and electronic cigarettes.  If you have any questions.  Other tests that may be performed during your second trimester include:  Blood tests that check for: ? Low iron levels (anemia). ? High blood sugar that affects pregnant women (gestational diabetes) between 31 and 28 weeks. ? Rh antibodies. This is to check for a protein on red blood cells (Rh factor).  Urine tests to check for infections, diabetes, or protein in the urine.  An ultrasound to confirm the proper growth and development of the baby.  An amniocentesis to check for possible genetic problems.  Fetal screens for spina bifida  and Down syndrome.  HIV (human immunodeficiency virus) testing. Routine prenatal testing includes screening for HIV, unless you choose not to have this test.  Follow these instructions at home: Medicines  Follow your health care provider's instructions regarding medicine use. Specific medicines may be either safe or unsafe to take during pregnancy.  Take a prenatal vitamin that contains at least 600 micrograms (mcg) of folic acid.  If you develop constipation, try taking a stool softener if your health care provider approves. Eating and drinking  Eat a balanced diet that includes fresh fruits and vegetables, whole grains, good sources of protein such as meat, eggs, or tofu, and low-fat dairy. Your health care provider will help you determine the amount of weight gain that is right for you.  Avoid raw meat and uncooked cheese. These carry germs that can cause birth defects in the baby.  If you have low calcium intake from food, talk to your health care provider about whether you should take a daily calcium supplement.  Limit foods that are high in fat and  processed sugars, such as fried and sweet foods.  To prevent constipation: ? Drink enough fluid to keep your urine clear or pale yellow. ? Eat foods that are high in fiber, such as fresh fruits and vegetables, whole grains, and beans. Activity  Exercise only as directed by your health care provider. Most women can continue their usual exercise routine during pregnancy. Try to exercise for 30 minutes at least 5 days a week. Stop exercising if you experience uterine contractions.  Avoid heavy lifting, wear low heel shoes, and practice good posture.  A sexual relationship may be continued unless your health care provider directs you otherwise. Relieving pain and discomfort  Wear a good support bra to prevent discomfort from breast tenderness.  Take warm sitz baths to soothe any pain or discomfort caused by hemorrhoids. Use hemorrhoid cream if your health care provider approves.  Rest with your legs elevated if you have leg cramps or low back pain.  If you develop varicose veins, wear support hose. Elevate your feet for 15 minutes, 3-4 times a day. Limit salt in your diet. Prenatal Care  Write down your questions. Take them to your prenatal visits.  Keep all your prenatal visits as told by your health care provider. This is important. Safety  Wear your seat belt at all times when driving.  Make a list of emergency phone numbers, including numbers for family, friends, the hospital, and police and fire departments. General instructions  Ask your health care provider for a referral to a local prenatal education class. Begin classes no later than the beginning of month 6 of your pregnancy.  Ask for help if you have counseling or nutritional needs during pregnancy. Your health care provider can offer advice or refer you to specialists for help with various needs.  Do not use hot tubs, steam rooms, or saunas.  Do not douche or use tampons or scented sanitary pads.  Do not cross your  legs for long periods of time.  Avoid cat litter boxes and soil used by cats. These carry germs that can cause birth defects in the baby and possibly loss of the fetus by miscarriage or stillbirth.  Avoid all smoking, herbs, alcohol, and unprescribed drugs. Chemicals in these products can affect the formation and growth of the baby.  Do not use any products that contain nicotine or tobacco, such as cigarettes and e-cigarettes. If you need help quitting, ask your health  care provider.  Visit your dentist if you have not gone yet during your pregnancy. Use a soft toothbrush to brush your teeth and be gentle when you floss. Contact a health care provider if:  You have dizziness.  You have mild pelvic cramps, pelvic pressure, or nagging pain in the abdominal area.  You have persistent nausea, vomiting, or diarrhea.  You have a bad smelling vaginal discharge.  You have pain when you urinate. Get help right away if:  You have a fever.  You are leaking fluid from your vagina.  You have spotting or bleeding from your vagina.  You have severe abdominal cramping or pain.  You have rapid weight gain or weight loss.  You have shortness of breath with chest pain.  You notice sudden or extreme swelling of your face, hands, ankles, feet, or legs.  You have not felt your baby move in over an hour.  You have severe headaches that do not go away when you take medicine.  You have vision changes. Summary  The second trimester is from week 14 through week 27 (months 4 through 6). It is also a time when the fetus is growing rapidly.  Your body goes through many changes during pregnancy. The changes vary from woman to woman.  Avoid all smoking, herbs, alcohol, and unprescribed drugs. These chemicals affect the formation and growth your baby.  Do not use any tobacco products, such as cigarettes, chewing tobacco, and e-cigarettes. If you need help quitting, ask your health care provider.   Contact your health care provider if you have any questions. Keep all prenatal visits as told by your health care provider. This is important. This information is not intended to replace advice given to you by your health care provider. Make sure you discuss any questions you have with your health care provider. Document Released: 08/28/2001 Document Revised: 02/09/2016 Document Reviewed: 11/04/2012 Elsevier Interactive Patient Education  2017 [employer].

## 2019-03-26 NOTE — Progress Notes (Signed)
   LOW-RISK PREGNANCY VISIT Patient name: Helen Sims MRN [id number]  Date of birth: Sep 12, 2001 Chief Complaint:   Routine Prenatal Visit ([place] today)  History of Present Illness:   Helen Sims is a 18 y.o. G1P0 female at [redacted]w[redacted]d with an Estimated Date of Delivery: 08/22/19 being seen today for ongoing management of a low-risk pregnancy.  Today she reports no complaints.  . Vag. Bleeding: None.  Movement: Present. denies leaking of fluid. Review of Systems:   Pertinent items are noted in HPI Denies abnormal vaginal discharge w/ itching/odor/irritation, headaches, visual changes, shortness of breath, chest pain, abdominal pain, severe nausea/vomiting, or problems with urination or bowel movements unless otherwise stated above. Pertinent History Reviewed:  Reviewed past medical,surgical, social, obstetrical and family history.  Reviewed problem list, medications and allergies. Physical Assessment:   Vitals:   03/26/19 1419  BP: 128/80  Pulse: (!) 102  Weight: 224 lb (101.6 kg)  Body mass index is 39.68 kg/m.        Physical Examination:   General appearance: Well appearing, and in no distress  Mental status: Alert, oriented to person, place, and time  Skin: Warm & dry  Cardiovascular: Normal heart rate noted  Respiratory: Normal respiratory effort, no distress  Abdomen: Soft, gravid, nontender  Pelvic: Cervical exam deferred         Extremities: Edema: Trace  Fetal Status: Fetal Heart Rate (bpm): 152 u/s   Movement: Present    [place] 18+5 wks,breech,anterior placenta gr 0,cx 4.2 cm,svp of fluid 4.5 cm,fhr 152 bpm,normal ovaries bilat,efw 271 g 64%,anatomy complete,no obvious abnormalities  Results for orders placed or performed in visit on 03/26/19 (from the past 24 hour(s))  POC Urinalysis Dipstick OB   Collection Time: 03/26/19  2:22 PM  Result Value Ref Range   Color, UA     Clarity, UA     Glucose, UA Negative Negative   Bilirubin, UA     Ketones, UA neg    Spec Grav, UA      Blood, UA neg    pH, UA     POC,PROTEIN,UA Negative Negative, Trace, Small (1+), Moderate (2+), Large (3+), 4+   Urobilinogen, UA     Nitrite, UA neg    Leukocytes, UA Trace (A) Negative   Appearance     Odor      Assessment & Plan:  1) Low-risk pregnancy G1P0 at [redacted]w[redacted]d with an Estimated Date of Delivery: 08/22/19    Meds: No orders of the defined types were placed in this encounter.  Labs/procedures today: anatomy u/s  Plan:  Continue routine obstetrical care   Reviewed: Preterm labor symptoms and general obstetric precautions including but not limited to vaginal bleeding, contractions, leaking of fluid and fetal movement were reviewed in detail with the patient.  All questions were answered. Hasn't gotten bp cuff yet, to go check at [facility] today, let us know if any issues getting it. Check bp weekly, let us know if >140/90.   Follow-up: Return in about 4 weeks (around 04/23/2019) for [place], [place].  Orders Placed This Encounter  Procedures  . POC Urinalysis Dipstick OB   Roma Schanz CNM, [facility] 03/26/2019 2:50 PM

## 2019-04-15 ENCOUNTER — Telehealth: Payer: Self-pay | Admitting: *Deleted

## 2019-04-15 NOTE — Telephone Encounter (Signed)
Pt doesn't have a phone. Pt's mom stated pt is having pain in low right abd and hasn't felt baby movement in 3 days. I advised she's just starting to feel baby flutters so it will be hard to feel all the movement. Try Tylenol for discomfort. If she starts bleeding or leaking fluid, let us know. Can also get a belly band for support. Pt's mom voiced understanding. [place]

## 2019-04-22 ENCOUNTER — Telehealth: Payer: Self-pay | Admitting: Advanced Practice Midwife

## 2019-04-22 NOTE — Telephone Encounter (Signed)
Called patient and left message informing her that we are not allowing any visitors or children to come to visit with her at this time and we are requiring a mask to be worn during the visit. Asked if she has had any exposure to anyone suspected or confirmed of having COVID-19 or if she is experiencing any of the following: fever, cough, sob, muscle pain, severe headache, sore throat, diarrhea, loss of taste or smell or ear, nose or throat discomfort to call and reschedule.  Encouraged to do e-check-in and Hello Patient prior to arrival and may call our office on arrive to our office parking lot in order for [place] to complete registration over the phone.   Just a reminder that your appointment for tomorrow is scheduled as a virtual visit and NOT in person.  We will call you around your appointment time to get you checked in.  This visit is through your mychart account, so please make sure you have the app downloaded on your device. Have a great day.

## 2019-04-23 ENCOUNTER — Telehealth: Payer: 59 | Admitting: Advanced Practice Midwife

## 2019-06-02 ENCOUNTER — Other Ambulatory Visit: Payer: Self-pay

## 2019-06-02 ENCOUNTER — Ambulatory Visit (INDEPENDENT_AMBULATORY_CARE_PROVIDER_SITE_OTHER): Payer: 59 | Admitting: Internal Medicine

## 2019-06-02 VITALS — BP 139/86 | HR 107 | Wt 245.0 lb

## 2019-06-02 DIAGNOSIS — Z3403 Encounter for supervision of normal first pregnancy, third trimester: Secondary | ICD-10-CM

## 2019-06-02 DIAGNOSIS — O163 Unspecified maternal hypertension, third trimester: Secondary | ICD-10-CM

## 2019-06-02 DIAGNOSIS — Z3A28 28 weeks gestation of pregnancy: Secondary | ICD-10-CM

## 2019-06-02 NOTE — Patient Instructions (Signed)
Call the office or go to [facility] if:  You begin to have strong, frequent contractions  Your water breaks.  Sometimes it is a big gush of fluid, sometimes it is just a trickle that keeps getting your panties wet or running down your legs  You have vaginal bleeding.  It is normal to have a small amount of spotting if your cervix was checked.   You don't feel your baby moving like normal.  If you don't, get you something to eat and drink and lay down and focus on feeling your baby move.  You should feel at least 10 movements in 2 hours.  If you don't, you should call the office or go to [facility].    Tdap Vaccine  It is recommended that you get the Tdap vaccine during the third trimester of EACH pregnancy to help protect your baby from getting pertussis (whooping cough)  27-36 weeks is the BEST time to do this so that you can pass the protection on to your baby. During pregnancy is better than after pregnancy, but if you are unable to get it during pregnancy it will be offered at the hospital.   You can get this vaccine with us, at the health department, your family doctor, or some local pharmacies  Everyone who will be around your baby should also be up-to-date on their vaccines before the baby comes. Adults (who are not pregnant) only need 1 dose of Tdap during adulthood.   Third Trimester of Pregnancy The third trimester is from week 29 through week 42, months 7 through 9. The third trimester is a time when the fetus is growing rapidly. At the end of the ninth month, the fetus is about 20 inches in length and weighs 6-10 pounds.  BODY CHANGES Your body goes through many changes during pregnancy. The changes vary from woman to woman.   Your weight will continue to increase. You can expect to gain 25-35 pounds (11-16 kg) by the end of the pregnancy.  You may begin to get stretch marks on your hips, abdomen, and breasts.  You may urinate more often because the fetus is  moving lower into your pelvis and pressing on your bladder.  You may develop or continue to have heartburn as a result of your pregnancy.  You may develop constipation because certain hormones are causing the muscles that push waste through your intestines to slow down.  You may develop hemorrhoids or swollen, bulging veins (varicose veins).  You may have pelvic pain because of the weight gain and pregnancy hormones relaxing your joints between the bones in your pelvis. Backaches may result from overexertion of the muscles supporting your posture.  You may have changes in your hair. These can include thickening of your hair, rapid growth, and changes in texture. Some women also have hair loss during or after pregnancy, or hair that feels dry or thin. Your hair will most likely return to normal after your baby is born.  Your breasts will continue to grow and be tender. A yellow discharge may leak from your breasts called colostrum.  Your belly button may stick out.  You may feel short of breath because of your expanding uterus.  You may notice the fetus "dropping," or moving lower in your abdomen.  You may have a bloody mucus discharge. This usually occurs a few days to a week before labor begins.  Your cervix becomes thin and soft (effaced) near your due date. WHAT TO EXPECT AT   YOUR PRENATAL EXAMS  You will have prenatal exams every 2 weeks until week 89. Then, you will have weekly prenatal exams. During a routine prenatal visit:  You will be weighed to make sure you and the fetus are growing normally.  Your blood pressure is taken.  Your abdomen will be measured to track your baby's growth.  The fetal heartbeat will be listened to.  Any test results from the previous visit will be discussed.  You may have a cervical check near your due date to see if you have effaced. At around 36 weeks, your caregiver will check your cervix. At the same time, your caregiver will also perform a  test on the secretions of the vaginal tissue. This test is to determine if a type of bacteria, Group B streptococcus, is present. Your caregiver will explain this further. Your caregiver may ask you:  What your birth plan is.  How you are feeling.  If you are feeling the baby move.  If you have had any abnormal symptoms, such as leaking fluid, bleeding, severe headaches, or abdominal cramping.  If you have any questions. Other tests or screenings that may be performed during your third trimester include:  Blood tests that check for low iron levels (anemia).  Fetal testing to check the health, activity level, and growth of the fetus. Testing is done if you have certain medical conditions or if there are problems during the pregnancy. FALSE LABOR You may feel small, irregular contractions that eventually go away. These are called Braxton Hicks contractions, or false labor. Contractions may last for hours, days, or even weeks before true labor sets in. If contractions come at regular intervals, intensify, or become painful, it is best to be seen by your caregiver.  SIGNS OF LABOR   Menstrual-like cramps.  Contractions that are 5 minutes apart or less.  Contractions that start on the top of the uterus and spread down to the lower abdomen and back.  A sense of increased pelvic pressure or back pain.  A watery or bloody mucus discharge that comes from the vagina. If you have any of these signs before the 37th week of pregnancy, call your caregiver right away. You need to go to the hospital to get checked immediately. HOME CARE INSTRUCTIONS   Avoid all smoking, herbs, alcohol, and unprescribed drugs. These chemicals affect the formation and growth of the baby.  Follow your caregiver's instructions regarding medicine use. There are medicines that are either safe or unsafe to take during pregnancy.  Exercise only as directed by your caregiver. Experiencing uterine cramps is a good sign to  stop exercising.  Continue to eat regular, healthy meals.  Wear a good support bra for breast tenderness.  Do not use hot tubs, steam rooms, or saunas.  Wear your seat belt at all times when driving.  Avoid raw meat, uncooked cheese, cat litter boxes, and soil used by cats. These carry germs that can cause birth defects in the baby.  Take your prenatal vitamins.  Try taking a stool softener (if your caregiver approves) if you develop constipation. Eat more high-fiber foods, such as fresh vegetables or fruit and whole grains. Drink plenty of fluids to keep your urine clear or pale yellow.  Take warm sitz baths to soothe any pain or discomfort caused by hemorrhoids. Use hemorrhoid cream if your caregiver approves.  If you develop varicose veins, wear support hose. Elevate your feet for 15 minutes, 3-4 times a day. Limit salt in your  diet.  Avoid heavy lifting, wear low heal shoes, and practice good posture.  Rest a lot with your legs elevated if you have leg cramps or low back pain.  Visit your dentist if you have not gone during your pregnancy. Use a soft toothbrush to brush your teeth and be gentle when you floss.  A sexual relationship may be continued unless your caregiver directs you otherwise.  Do not travel far distances unless it is absolutely necessary and only with the approval of your caregiver.  Take prenatal classes to understand, practice, and ask questions about the labor and delivery.  Make a trial run to the hospital.  Pack your hospital bag.  Prepare the baby's nursery.  Continue to go to all your prenatal visits as directed by your caregiver. SEEK MEDICAL CARE IF:  You are unsure if you are in labor or if your water has broken.  You have dizziness.  You have mild pelvic cramps, pelvic pressure, or nagging pain in your abdominal area.  You have persistent nausea, vomiting, or diarrhea.  You have a bad smelling vaginal discharge.  You have pain with  urination. SEEK IMMEDIATE MEDICAL CARE IF:   You have a fever.  You are leaking fluid from your vagina.  You have spotting or bleeding from your vagina.  You have severe abdominal cramping or pain.  You have rapid weight loss or gain.  You have shortness of breath with chest pain.  You notice sudden or extreme swelling of your face, hands, ankles, feet, or legs.  You have not felt your baby move in over an hour.  You have severe headaches that do not go away with medicine.  You have vision changes. Document Released: 08/28/2001 Document Revised: 09/08/2013 Document Reviewed: 11/04/2012 [facility] Patient Information 2015 [place], [place]. This information is not intended to replace advice given to you by your health care provider. Make sure you discuss any questions you have with your health care provider.

## 2019-06-02 NOTE — Progress Notes (Signed)
   PRENATAL VISIT NOTE  Subjective:  Helen Sims is a 18 y.o. G1P0 at [redacted]w[redacted]d being seen today for ongoing prenatal care.  She is currently monitored for the following issues for this low-risk pregnancy and has Supervision of normal first pregnancy and Elevated blood pressure affecting pregnancy in third trimester, antepartum on their problem list.  Patient reports no complaints.  Contractions: Not present. Vag. Bleeding: None.  Movement: Present. Denies leaking of fluid.   The following portions of the patient's history were reviewed and updated as appropriate: allergies, current medications, past family history, past medical history, past social history, past surgical history and problem list.   Objective:   Vitals:   06/02/19 1539  BP: 139/86  Pulse: (!) 107  Weight: 111.1 kg    Fetal Status: Fetal Heart Rate (bpm): 150 Fundal Height: 28 cm Movement: Present     General:  Alert, oriented and cooperative. Patient is in no acute distress.  Skin: Skin is warm and dry. No rash noted.   Cardiovascular: Normal heart rate noted  Respiratory: Normal respiratory effort, no problems with respiration noted  Abdomen: Soft, gravid, appropriate for gestational age.  Pain/Pressure: Present     Pelvic: Cervical exam deferred        Extremities: Normal range of motion.  Edema: Trace  Mental Status: Normal mood and affect. Normal behavior. Normal judgment and thought content.   Assessment and Plan:  Pregnancy: G1P0 at [redacted]w[redacted]d  1. Encounter for supervision of normal first pregnancy in third trimester Patient has not had any prenatal care visits for 10 weeks. Patient to return for 28 week labs and GTT this week. Will also receive Tdap at that RN visit.   2. Elevated blood pressure affecting pregnancy in third trimester, antepartum BP noted to be elevated; prior BPs within range during this pregnancy. Patient does not yet meet criteria for gHTN. Will obtain [place] labs when she returns for lab/RN  visit, discussed this with Estill Bamberg. Patient asymptomatic. She has a BP cuff at home. Will check her BP in AM and record. She is to bring log to the next office visit with provider.    Preterm labor symptoms and general obstetric precautions including but not limited to vaginal bleeding, contractions, leaking of fluid and fetal movement were reviewed in detail with the patient. Please refer to After Visit Summary for other counseling recommendations.   Return in about 2 weeks (around 06/16/2019) for routine PNC.  Future Appointments  Date Time Provider [place]  06/05/2019  9:30 AM [facility]  06/16/2019 11:10 AM Roma Schanz, CNM [facility] [place], DO

## 2019-06-04 ENCOUNTER — Telehealth: Payer: Self-pay | Admitting: Obstetrics & Gynecology

## 2019-06-04 DIAGNOSIS — O163 Unspecified maternal hypertension, third trimester: Secondary | ICD-10-CM | POA: Insufficient documentation

## 2019-06-04 NOTE — Telephone Encounter (Signed)
We have you scheduled for an upcoming appointment at our office. At this time, patients are encouraged to come alone to their visits whenever possible, however, a support person, over age 20, may accompany you to your appointment if assistance is needed for safety or care concerns. Otherwise, support persons should remain outside until the visit is complete.  ° °We ask if you have had any exposure to anyone suspected or confirmed of having COVID-19 or if you are experiencing any of the following, to call and reschedule your appointment: fever, cough, shortness of breath, muscle pain, diarrhea, rash, vomiting, abdominal pain, red eye, weakness, bruising, bleeding, joint pain, or a severe headache.  ° °Please know we will ask you these questions or similar questions when you arrive for your appointment and again it’s how we are keeping everyone safe.   ° °Also,to keep you safe, please use the provided hand sanitizer when you enter the office. We are asking everyone in the office to wear a mask to help prevent the spread of °germs. If you have a mask of your own, please wear it to your appointment, if not, we are happy to provide one for you. ° °Thank you for understanding and your cooperation.  ° ° °[facility] Staff °

## 2019-06-05 ENCOUNTER — Telehealth: Payer: Self-pay | Admitting: Obstetrics & Gynecology

## 2019-06-05 ENCOUNTER — Other Ambulatory Visit: Payer: 59

## 2019-06-05 NOTE — Telephone Encounter (Signed)
Called patient regarding appointment and the following message was left: ° ° °We have you scheduled for an upcoming appointment at our office. At this time, patients are encouraged to come alone to their visits whenever possible, however, a support person, over age 20, may accompany you to your appointment if assistance is needed for safety or care concerns. Otherwise, support persons should remain outside until the visit is complete.  ° °We ask if you have had any exposure to anyone suspected or confirmed of having COVID-19 or if you are experiencing any of the following, to call and reschedule your appointment: fever, cough, shortness of breath, muscle pain, diarrhea, rash, vomiting, abdominal pain, red eye, weakness, bruising, bleeding, joint pain, or a severe headache.  ° °Please know we will ask you these questions or similar questions when you arrive for your appointment and again it’s how we are keeping everyone safe.   ° °Also,to keep you safe, please use the provided hand sanitizer when you enter the office. We are asking everyone in the office to wear a mask to help prevent the spread of °germs. If you have a mask of your own, please wear it to your appointment, if not, we are happy to provide one for you. ° °Thank you for understanding and your cooperation.  ° ° °[facility] Staff ° ° ° ° ° °

## 2019-06-08 ENCOUNTER — Other Ambulatory Visit: Payer: Self-pay

## 2019-06-08 ENCOUNTER — Other Ambulatory Visit: Payer: 59

## 2019-06-15 ENCOUNTER — Telehealth: Payer: Self-pay | Admitting: Women's Health

## 2019-06-15 NOTE — Telephone Encounter (Signed)
We have you scheduled for an upcoming appointment at our office. At this time, patients are encouraged to come alone to their visits whenever possible, however, a support person, over age 20, may accompany you to your appointment if assistance is needed for safety or care concerns. Otherwise, support persons should remain outside until the visit is complete.  ° °We ask if you have had any exposure to anyone suspected or confirmed of having COVID-19 or if you are experiencing any of the following, to call and reschedule your appointment: fever, cough, shortness of breath, muscle pain, diarrhea, rash, vomiting, abdominal pain, red eye, weakness, bruising, bleeding, joint pain, or a severe headache.  ° °Please know we will ask you these questions or similar questions when you arrive for your appointment and again it’s how we are keeping everyone safe.   ° °Also,to keep you safe, please use the provided hand sanitizer when you enter the office. We are asking everyone in the office to wear a mask to help prevent the spread of °germs. If you have a mask of your own, please wear it to your appointment, if not, we are happy to provide one for you. ° °Thank you for understanding and your cooperation.  ° ° °[facility] Staff °

## 2019-06-16 ENCOUNTER — Telehealth: Payer: Self-pay | Admitting: Advanced Practice Midwife

## 2019-06-16 ENCOUNTER — Encounter: Payer: 59 | Admitting: Women's Health

## 2019-06-16 NOTE — Telephone Encounter (Signed)
Called patient regarding appointment and the following message was left: ° ° °We have you scheduled for an upcoming appointment at our office. At this time, patients are encouraged to come alone to their visits whenever possible, however, a support person, over age 20, may accompany you to your appointment if assistance is needed for safety or care concerns. Otherwise, support persons should remain outside until the visit is complete.  ° °We ask if you have had any exposure to anyone suspected or confirmed of having COVID-19 or if you are experiencing any of the following, to call and reschedule your appointment: fever, cough, shortness of breath, muscle pain, diarrhea, rash, vomiting, abdominal pain, red eye, weakness, bruising, bleeding, joint pain, or a severe headache.  ° °Please know we will ask you these questions or similar questions when you arrive for your appointment and again it’s how we are keeping everyone safe.   ° °Also,to keep you safe, please use the provided hand sanitizer when you enter the office. We are asking everyone in the office to wear a mask to help prevent the spread of °germs. If you have a mask of your own, please wear it to your appointment, if not, we are happy to provide one for you. ° °Thank you for understanding and your cooperation.  ° ° °[facility] Staff ° ° ° ° ° °

## 2019-06-17 ENCOUNTER — Encounter: Payer: 59 | Admitting: Advanced Practice Midwife

## 2019-06-19 ENCOUNTER — Telehealth: Payer: Self-pay | Admitting: Obstetrics and Gynecology

## 2019-06-19 NOTE — Telephone Encounter (Signed)

## 2019-06-22 ENCOUNTER — Ambulatory Visit (INDEPENDENT_AMBULATORY_CARE_PROVIDER_SITE_OTHER): Payer: 59 | Admitting: Obstetrics and Gynecology

## 2019-06-22 ENCOUNTER — Other Ambulatory Visit: Payer: Self-pay

## 2019-06-22 ENCOUNTER — Encounter: Payer: Self-pay | Admitting: Obstetrics and Gynecology

## 2019-06-22 VITALS — BP 131/87 | HR 127 | Wt 250.2 lb

## 2019-06-22 DIAGNOSIS — Z3A31 31 weeks gestation of pregnancy: Secondary | ICD-10-CM

## 2019-06-22 DIAGNOSIS — Z3403 Encounter for supervision of normal first pregnancy, third trimester: Secondary | ICD-10-CM

## 2019-06-22 DIAGNOSIS — Z1389 Encounter for screening for other disorder: Secondary | ICD-10-CM

## 2019-06-22 DIAGNOSIS — Z331 Pregnant state, incidental: Secondary | ICD-10-CM

## 2019-06-22 LAB — POCT URINALYSIS DIPSTICK OB
Blood, UA: NEGATIVE
Glucose, UA: NEGATIVE
Ketones, UA: NEGATIVE
Leukocytes, UA: NEGATIVE
Nitrite, UA: NEGATIVE
POC,PROTEIN,UA: NEGATIVE

## 2019-06-22 NOTE — Progress Notes (Signed)
Subjective:  Helen Sims is a 18 y.o. G1P0 at [redacted]w[redacted]d being seen today for ongoing prenatal care.  She is currently monitored for the following issues for this low-risk pregnancy and has Supervision of normal first pregnancy and Elevated blood pressure affecting pregnancy in third trimester, antepartum on their problem list.  Patient reports no complaints.  Contractions: Not present.  .  Movement: Present. Denies leaking of fluid.   The following portions of the patient's history were reviewed and updated as appropriate: allergies, current medications, past family history, past medical history, past social history, past surgical history and problem list. Problem list updated.  Objective:   Vitals:   06/22/19 1010  BP: 131/87  Pulse: (!) 127  Weight: 250 lb 3.2 oz (113.5 kg)    Fetal Status:     Movement: Present     General:  Alert, oriented and cooperative. Patient is in no acute distress.  Skin: Skin is warm and dry. No rash noted.   Cardiovascular: Normal heart rate noted  Respiratory: Normal respiratory effort, no problems with respiration noted  Abdomen: Soft, gravid, appropriate for gestational age. Pain/Pressure: Absent     Pelvic:  Cervical exam deferred        Extremities: Normal range of motion.  Edema: None  Mental Status: Normal mood and affect. Normal behavior. Normal judgment and thought content.   Urinalysis:      Assessment and Plan:  Pregnancy: G1P0 at [redacted]w[redacted]d  1. Encounter for supervision of normal first pregnancy in third trimester Stable [place] tomorrow Declined flu vaccine Considering TDAP  2. Pregnant state, incidental  - POC Urinalysis [facility]  3. Screening for genitourinary condition  - POC Urinalysis [facility]  Preterm labor symptoms and general obstetric precautions including but not limited to vaginal bleeding, contractions, leaking of fluid and fetal movement were reviewed in detail with the patient. Please refer to After Visit Summary  for other counseling recommendations.  Return in about 2 weeks (around 07/06/2019) for OB visit, virtual.   Chancy Milroy, MD

## 2019-06-22 NOTE — Patient Instructions (Signed)
Third Trimester of Pregnancy The third trimester is from week 28 through week 40 (months 7 through 9). The third trimester is a time when the unborn baby (fetus) is growing rapidly. At the end of the ninth month, the fetus is about 20 inches in length and weighs 6-10 pounds. Body changes during your third trimester Your body will continue to go through many changes during pregnancy. The changes vary from woman to woman. During the third trimester:  Your weight will continue to increase. You can expect to gain 25-35 pounds (11-16 kg) by the end of the pregnancy.  You may begin to get stretch marks on your hips, abdomen, and breasts.  You may urinate more often because the fetus is moving lower into your pelvis and pressing on your bladder.  You may develop or continue to have heartburn. This is caused by increased hormones that slow down muscles in the digestive tract.  You may develop or continue to have constipation because increased hormones slow digestion and cause the muscles that push waste through your intestines to relax.  You may develop hemorrhoids. These are swollen veins (varicose veins) in the rectum that can itch or be painful.  You may develop swollen, bulging veins (varicose veins) in your legs.  You may have increased body aches in the pelvis, back, or thighs. This is due to weight gain and increased hormones that are relaxing your joints.  You may have changes in your hair. These can include thickening of your hair, rapid growth, and changes in texture. Some women also have hair loss during or after pregnancy, or hair that feels dry or thin. Your hair will most likely return to normal after your baby is born.  Your breasts will continue to grow and they will continue to become tender. A yellow fluid (colostrum) may leak from your breasts. This is the first milk you are producing for your baby.  Your belly button may stick out.  You may notice more swelling in your hands,  face, or ankles.  You may have increased tingling or numbness in your hands, arms, and legs. The skin on your belly may also feel numb.  You may feel short of breath because of your expanding uterus.  You may have more problems sleeping. This can be caused by the size of your belly, increased need to urinate, and an increase in your body's metabolism.  You may notice the fetus "dropping," or moving lower in your abdomen (lightening).  You may have increased vaginal discharge.  You may notice your joints feel loose and you may have pain around your pelvic bone. What to expect at prenatal visits You will have prenatal exams every 2 weeks until week 36. Then you will have weekly prenatal exams. During a routine prenatal visit:  You will be weighed to make sure you and the baby are growing normally.  Your blood pressure will be taken.  Your abdomen will be measured to track your baby's growth.  The fetal heartbeat will be listened to.  Any test results from the previous visit will be discussed.  You may have a cervical check near your due date to see if your cervix has softened or thinned (effaced).  You will be tested for Group B streptococcus. This happens between 35 and 37 weeks. Your health care provider may ask you:  What your birth plan is.  How you are feeling.  If you are feeling the baby move.  If you have had any abnormal   symptoms, such as leaking fluid, bleeding, severe headaches, or abdominal cramping.  If you are using any tobacco products, including cigarettes, chewing tobacco, and electronic cigarettes.  If you have any questions. Other tests or screenings that may be performed during your third trimester include:  Blood tests that check for low iron levels (anemia).  Fetal testing to check the health, activity level, and growth of the fetus. Testing is done if you have certain medical conditions or if there are problems during the pregnancy.  Nonstress test  (NST). This test checks the health of your baby to make sure there are no signs of problems, such as the baby not getting enough oxygen. During this test, a belt is placed around your belly. The baby is made to move, and its heart rate is monitored during movement. What is false labor? False labor is a condition in which you feel small, irregular tightenings of the muscles in the womb (contractions) that usually go away with rest, changing position, or drinking water. These are called Braxton Hicks contractions. Contractions may last for hours, days, or even weeks before true labor sets in. If contractions come at regular intervals, become more frequent, increase in intensity, or become painful, you should see your health care provider. What are the signs of labor?  Abdominal cramps.  Regular contractions that start at 10 minutes apart and become stronger and more frequent with time.  Contractions that start on the top of the uterus and spread down to the lower abdomen and back.  Increased pelvic pressure and dull back pain.  A watery or bloody mucus discharge that comes from the vagina.  Leaking of amniotic fluid. This is also known as your "water breaking." It could be a slow trickle or a gush. Let your health care provider know if it has a color or strange odor. If you have any of these signs, call your health care provider right away, even if it is before your due date. Follow these instructions at home: Medicines  Follow your health care provider's instructions regarding medicine use. Specific medicines may be either safe or unsafe to take during pregnancy.  Take a prenatal vitamin that contains at least 600 micrograms (mcg) of folic acid.  If you develop constipation, try taking a stool softener if your health care provider approves. Eating and drinking   Eat a balanced diet that includes fresh fruits and vegetables, whole grains, good sources of protein such as meat, eggs, or tofu,  and low-fat dairy. Your health care provider will help you determine the amount of weight gain that is right for you.  Avoid raw meat and uncooked cheese. These carry germs that can cause birth defects in the baby.  If you have low calcium intake from food, talk to your health care provider about whether you should take a daily calcium supplement.  Eat four or five small meals rather than three large meals a day.  Limit foods that are high in fat and processed sugars, such as fried and sweet foods.  To prevent constipation: ? Drink enough fluid to keep your urine clear or pale yellow. ? Eat foods that are high in fiber, such as fresh fruits and vegetables, whole grains, and beans. Activity  Exercise only as directed by your health care provider. Most women can continue their usual exercise routine during pregnancy. Try to exercise for 30 minutes at least 5 days a week. Stop exercising if you experience uterine contractions.  Avoid heavy lifting.  Do   not exercise in extreme heat or humidity, or at high altitudes.  Wear low-heel, comfortable shoes.  Practice good posture.  You may continue to have sex unless your health care provider tells you otherwise. Relieving pain and discomfort  Take frequent breaks and rest with your legs elevated if you have leg cramps or low back pain.  Take warm sitz baths to soothe any pain or discomfort caused by hemorrhoids. Use hemorrhoid cream if your health care provider approves.  Wear a good support bra to prevent discomfort from breast tenderness.  If you develop varicose veins: ? Wear support pantyhose or compression stockings as told by your healthcare provider. ? Elevate your feet for 15 minutes, 3-4 times a day. Prenatal care  Write down your questions. Take them to your prenatal visits.  Keep all your prenatal visits as told by your health care provider. This is important. Safety  Wear your seat belt at all times when driving.  Make  a list of emergency phone numbers, including numbers for family, friends, the hospital, and police and fire departments. General instructions  Avoid cat litter boxes and soil used by cats. These carry germs that can cause birth defects in the baby. If you have a cat, ask someone to clean the litter box for you.  Do not travel far distances unless it is absolutely necessary and only with the approval of your health care provider.  Do not use hot tubs, steam rooms, or saunas.  Do not drink alcohol.  Do not use any products that contain nicotine or tobacco, such as cigarettes and e-cigarettes. If you need help quitting, ask your health care provider.  Do not use any medicinal herbs or unprescribed drugs. These chemicals affect the formation and growth of the baby.  Do not douche or use tampons or scented sanitary pads.  Do not cross your legs for long periods of time.  To prepare for the arrival of your baby: ? Take prenatal classes to understand, practice, and ask questions about labor and delivery. ? Make a trial run to the hospital. ? Visit the hospital and tour the maternity area. ? Arrange for maternity or paternity leave through employers. ? Arrange for family and friends to take care of pets while you are in the hospital. ? Purchase a rear-facing car seat and make sure you know how to install it in your car. ? Pack your hospital bag. ? Prepare the baby's nursery. Make sure to remove all pillows and stuffed animals from the baby's crib to prevent suffocation.  Visit your dentist if you have not gone during your pregnancy. Use a soft toothbrush to brush your teeth and be gentle when you floss. Contact a health care provider if:  You are unsure if you are in labor or if your water has broken.  You become dizzy.  You have mild pelvic cramps, pelvic pressure, or nagging pain in your abdominal area.  You have lower back pain.  You have persistent nausea, vomiting, or diarrhea.   You have an unusual or bad smelling vaginal discharge.  You have pain when you urinate. Get help right away if:  Your water breaks before 37 weeks.  You have regular contractions less than 5 minutes apart before 37 weeks.  You have a fever.  You are leaking fluid from your vagina.  You have spotting or bleeding from your vagina.  You have severe abdominal pain or cramping.  You have rapid weight loss or weight gain.  You have   shortness of breath with chest pain.  You notice sudden or extreme swelling of your face, hands, ankles, feet, or legs.  Your baby makes fewer than 10 movements in 2 hours.  You have severe headaches that do not go away when you take medicine.  You have vision changes. Summary  The third trimester is from week 28 through week 40, months 7 through 9. The third trimester is a time when the unborn baby (fetus) is growing rapidly.  During the third trimester, your discomfort may increase as you and your baby continue to gain weight. You may have abdominal, leg, and back pain, sleeping problems, and an increased need to urinate.  During the third trimester your breasts will keep growing and they will continue to become tender. A yellow fluid (colostrum) may leak from your breasts. This is the first milk you are producing for your baby.  False labor is a condition in which you feel small, irregular tightenings of the muscles in the womb (contractions) that eventually go away. These are called Braxton Hicks contractions. Contractions may last for hours, days, or even weeks before true labor sets in.  Signs of labor can include: abdominal cramps; regular contractions that start at 10 minutes apart and become stronger and more frequent with time; watery or bloody mucus discharge that comes from the vagina; increased pelvic pressure and dull back pain; and leaking of amniotic fluid. This information is not intended to replace advice given to you by your health  care provider. Make sure you discuss any questions you have with your health care provider. Document Released: 08/28/2001 Document Revised: 12/25/2018 Document Reviewed: 10/09/2016 Elsevier Patient Education  2020 [employer]  

## 2019-06-23 ENCOUNTER — Telehealth: Payer: Self-pay | Admitting: Obstetrics & Gynecology

## 2019-06-23 NOTE — Telephone Encounter (Signed)

## 2019-06-24 ENCOUNTER — Other Ambulatory Visit: Payer: Self-pay

## 2019-06-24 ENCOUNTER — Other Ambulatory Visit: Payer: 59

## 2019-06-24 DIAGNOSIS — Z3403 Encounter for supervision of normal first pregnancy, third trimester: Secondary | ICD-10-CM

## 2019-06-24 DIAGNOSIS — Z3A31 31 weeks gestation of pregnancy: Secondary | ICD-10-CM

## 2019-06-25 LAB — CBC
Hematocrit: 34.5 % (ref 34.0–46.6)
Hemoglobin: 11.5 g/dL (ref 11.1–15.9)
MCH: 26.9 pg (ref 26.6–33.0)
MCHC: 33.3 g/dL (ref 31.5–35.7)
MCV: 81 fL (ref 79–97)
Platelets: 334 10*3/uL (ref 150–450)
RBC: 4.28 x10E6/uL (ref 3.77–5.28)
RDW: 12.7 % (ref 11.7–15.4)
WBC: 13.6 10*3/uL — ABNORMAL HIGH (ref 3.4–10.8)

## 2019-06-25 LAB — ANTIBODY SCREEN: Antibody Screen: NEGATIVE

## 2019-06-25 LAB — HIV ANTIBODY (ROUTINE TESTING W REFLEX): HIV Screen 4th Generation wRfx: NONREACTIVE

## 2019-06-25 LAB — GLUCOSE TOLERANCE, 2 HOURS W/ 1HR
Glucose, 1 hour: 126 mg/dL (ref 65–179)
Glucose, 2 hour: 106 mg/dL (ref 65–152)
Glucose, Fasting: 88 mg/dL (ref 65–91)

## 2019-06-25 LAB — RPR: RPR Ser Ql: NONREACTIVE

## 2019-07-03 ENCOUNTER — Telehealth: Payer: Self-pay | Admitting: Women's Health

## 2019-07-03 NOTE — Telephone Encounter (Signed)
I called the patient and lmoam with her appointment time and restrictions.  She is scheduled for a virtual visit, but she is not signed up for mychart.  I have sent her a link and informed her of that and advised her to come to the office for her appointment if she does not get signed up.

## 2019-07-06 ENCOUNTER — Telehealth: Payer: 59 | Admitting: Women's Health

## 2019-07-22 ENCOUNTER — Ambulatory Visit (INDEPENDENT_AMBULATORY_CARE_PROVIDER_SITE_OTHER): Payer: 59 | Admitting: Obstetrics and Gynecology

## 2019-07-22 ENCOUNTER — Encounter: Payer: Self-pay | Admitting: Obstetrics and Gynecology

## 2019-07-22 ENCOUNTER — Other Ambulatory Visit: Payer: Self-pay

## 2019-07-22 VITALS — BP 130/83 | HR 113 | Wt 255.0 lb

## 2019-07-22 DIAGNOSIS — Z331 Pregnant state, incidental: Secondary | ICD-10-CM

## 2019-07-22 DIAGNOSIS — Z1389 Encounter for screening for other disorder: Secondary | ICD-10-CM

## 2019-07-22 DIAGNOSIS — Z3A35 35 weeks gestation of pregnancy: Secondary | ICD-10-CM

## 2019-07-22 DIAGNOSIS — Z3403 Encounter for supervision of normal first pregnancy, third trimester: Secondary | ICD-10-CM

## 2019-07-22 LAB — POCT URINALYSIS DIPSTICK OB
Blood, UA: NEGATIVE
Glucose, UA: NEGATIVE
Ketones, UA: NEGATIVE
Nitrite, UA: NEGATIVE
POC,PROTEIN,UA: NEGATIVE

## 2019-07-22 NOTE — Progress Notes (Signed)
Subjective:  Helen Sims is a 18 y.o. G1P0 at [redacted]w[redacted]d being seen today for ongoing prenatal care.  She is currently monitored for the following issues for this low-risk pregnancy and has Supervision of normal first pregnancy and Elevated blood pressure affecting pregnancy in third trimester, antepartum on their problem list.  Patient reports general discomforts of pregnancy.  Contractions: Irregular. Vag. Bleeding: None.  Movement: Present. Denies leaking of fluid.   The following portions of the patient's history were reviewed and updated as appropriate: allergies, current medications, past family history, past medical history, past social history, past surgical history and problem list. Problem list updated.  Objective:   Vitals:   07/22/19 1400  BP: 130/83  Pulse: (!) 113  Weight: 255 lb (115.7 kg)    Fetal Status:     Movement: Present     General:  Alert, oriented and cooperative. Patient is in no acute distress.  Skin: Skin is warm and dry. No rash noted.   Cardiovascular: Normal heart rate noted  Respiratory: Normal respiratory effort, no problems with respiration noted  Abdomen: Soft, gravid, appropriate for gestational age. Pain/Pressure: Present     Pelvic:  Cervical exam deferred        Extremities: Normal range of motion.  Edema: None  Mental Status: Normal mood and affect. Normal behavior. Normal judgment and thought content.   Urinalysis:      Assessment and Plan:  Pregnancy: G1P0 at [redacted]w[redacted]d  1. Encounter for supervision of normal first pregnancy in third trimester Stable GBS next visit  2. Screening for genitourinary condition  - POC Urinalysis [facility]  3. Pregnant state, incidental  - POC Urinalysis [facility]  Preterm labor symptoms and general obstetric precautions including but not limited to vaginal bleeding, contractions, leaking of fluid and fetal movement were reviewed in detail with the patient. Please refer to After Visit Summary for other  counseling recommendations.  Return in about 1 week (around 07/29/2019) for OB visit, face to face for GBS.   Chancy Milroy, MD

## 2019-07-22 NOTE — Patient Instructions (Signed)
Third Trimester of Pregnancy The third trimester is from week 28 through week 40 (months 7 through 9). The third trimester is a time when the unborn baby (fetus) is growing rapidly. At the end of the ninth month, the fetus is about 20 inches in length and weighs 6-10 pounds. Body changes during your third trimester Your body will continue to go through many changes during pregnancy. The changes vary from woman to woman. During the third trimester:  Your weight will continue to increase. You can expect to gain 25-35 pounds (11-16 kg) by the end of the pregnancy.  You may begin to get stretch marks on your hips, abdomen, and breasts.  You may urinate more often because the fetus is moving lower into your pelvis and pressing on your bladder.  You may develop or continue to have heartburn. This is caused by increased hormones that slow down muscles in the digestive tract.  You may develop or continue to have constipation because increased hormones slow digestion and cause the muscles that push waste through your intestines to relax.  You may develop hemorrhoids. These are swollen veins (varicose veins) in the rectum that can itch or be painful.  You may develop swollen, bulging veins (varicose veins) in your legs.  You may have increased body aches in the pelvis, back, or thighs. This is due to weight gain and increased hormones that are relaxing your joints.  You may have changes in your hair. These can include thickening of your hair, rapid growth, and changes in texture. Some women also have hair loss during or after pregnancy, or hair that feels dry or thin. Your hair will most likely return to normal after your baby is born.  Your breasts will continue to grow and they will continue to become tender. A yellow fluid (colostrum) may leak from your breasts. This is the first milk you are producing for your baby.  Your belly button may stick out.  You may notice more swelling in your hands,  face, or ankles.  You may have increased tingling or numbness in your hands, arms, and legs. The skin on your belly may also feel numb.  You may feel short of breath because of your expanding uterus.  You may have more problems sleeping. This can be caused by the size of your belly, increased need to urinate, and an increase in your body's metabolism.  You may notice the fetus "dropping," or moving lower in your abdomen (lightening).  You may have increased vaginal discharge.  You may notice your joints feel loose and you may have pain around your pelvic bone. What to expect at prenatal visits You will have prenatal exams every 2 weeks until week 36. Then you will have weekly prenatal exams. During a routine prenatal visit:  You will be weighed to make sure you and the baby are growing normally.  Your blood pressure will be taken.  Your abdomen will be measured to track your baby's growth.  The fetal heartbeat will be listened to.  Any test results from the previous visit will be discussed.  You may have a cervical check near your due date to see if your cervix has softened or thinned (effaced).  You will be tested for Group B streptococcus. This happens between 35 and 37 weeks. Your health care provider may ask you:  What your birth plan is.  How you are feeling.  If you are feeling the baby move.  If you have had any abnormal   symptoms, such as leaking fluid, bleeding, severe headaches, or abdominal cramping.  If you are using any tobacco products, including cigarettes, chewing tobacco, and electronic cigarettes.  If you have any questions. Other tests or screenings that may be performed during your third trimester include:  Blood tests that check for low iron levels (anemia).  Fetal testing to check the health, activity level, and growth of the fetus. Testing is done if you have certain medical conditions or if there are problems during the pregnancy.  Nonstress test  (NST). This test checks the health of your baby to make sure there are no signs of problems, such as the baby not getting enough oxygen. During this test, a belt is placed around your belly. The baby is made to move, and its heart rate is monitored during movement. What is false labor? False labor is a condition in which you feel small, irregular tightenings of the muscles in the womb (contractions) that usually go away with rest, changing position, or drinking water. These are called Braxton Hicks contractions. Contractions may last for hours, days, or even weeks before true labor sets in. If contractions come at regular intervals, become more frequent, increase in intensity, or become painful, you should see your health care provider. What are the signs of labor?  Abdominal cramps.  Regular contractions that start at 10 minutes apart and become stronger and more frequent with time.  Contractions that start on the top of the uterus and spread down to the lower abdomen and back.  Increased pelvic pressure and dull back pain.  A watery or bloody mucus discharge that comes from the vagina.  Leaking of amniotic fluid. This is also known as your "water breaking." It could be a slow trickle or a gush. Let your health care provider know if it has a color or strange odor. If you have any of these signs, call your health care provider right away, even if it is before your due date. Follow these instructions at home: Medicines  Follow your health care provider's instructions regarding medicine use. Specific medicines may be either safe or unsafe to take during pregnancy.  Take a prenatal vitamin that contains at least 600 micrograms (mcg) of folic acid.  If you develop constipation, try taking a stool softener if your health care provider approves. Eating and drinking   Eat a balanced diet that includes fresh fruits and vegetables, whole grains, good sources of protein such as meat, eggs, or tofu,  and low-fat dairy. Your health care provider will help you determine the amount of weight gain that is right for you.  Avoid raw meat and uncooked cheese. These carry germs that can cause birth defects in the baby.  If you have low calcium intake from food, talk to your health care provider about whether you should take a daily calcium supplement.  Eat four or five small meals rather than three large meals a day.  Limit foods that are high in fat and processed sugars, such as fried and sweet foods.  To prevent constipation: ? Drink enough fluid to keep your urine clear or pale yellow. ? Eat foods that are high in fiber, such as fresh fruits and vegetables, whole grains, and beans. Activity  Exercise only as directed by your health care provider. Most women can continue their usual exercise routine during pregnancy. Try to exercise for 30 minutes at least 5 days a week. Stop exercising if you experience uterine contractions.  Avoid heavy lifting.  Do   not exercise in extreme heat or humidity, or at high altitudes.  Wear low-heel, comfortable shoes.  Practice good posture.  You may continue to have sex unless your health care provider tells you otherwise. Relieving pain and discomfort  Take frequent breaks and rest with your legs elevated if you have leg cramps or low back pain.  Take warm sitz baths to soothe any pain or discomfort caused by hemorrhoids. Use hemorrhoid cream if your health care provider approves.  Wear a good support bra to prevent discomfort from breast tenderness.  If you develop varicose veins: ? Wear support pantyhose or compression stockings as told by your healthcare provider. ? Elevate your feet for 15 minutes, 3-4 times a day. Prenatal care  Write down your questions. Take them to your prenatal visits.  Keep all your prenatal visits as told by your health care provider. This is important. Safety  Wear your seat belt at all times when driving.  Make  a list of emergency phone numbers, including numbers for family, friends, the hospital, and police and fire departments. General instructions  Avoid cat litter boxes and soil used by cats. These carry germs that can cause birth defects in the baby. If you have a cat, ask someone to clean the litter box for you.  Do not travel far distances unless it is absolutely necessary and only with the approval of your health care provider.  Do not use hot tubs, steam rooms, or saunas.  Do not drink alcohol.  Do not use any products that contain nicotine or tobacco, such as cigarettes and e-cigarettes. If you need help quitting, ask your health care provider.  Do not use any medicinal herbs or unprescribed drugs. These chemicals affect the formation and growth of the baby.  Do not douche or use tampons or scented sanitary pads.  Do not cross your legs for long periods of time.  To prepare for the arrival of your baby: ? Take prenatal classes to understand, practice, and ask questions about labor and delivery. ? Make a trial run to the hospital. ? Visit the hospital and tour the maternity area. ? Arrange for maternity or paternity leave through employers. ? Arrange for family and friends to take care of pets while you are in the hospital. ? Purchase a rear-facing car seat and make sure you know how to install it in your car. ? Pack your hospital bag. ? Prepare the baby's nursery. Make sure to remove all pillows and stuffed animals from the baby's crib to prevent suffocation.  Visit your dentist if you have not gone during your pregnancy. Use a soft toothbrush to brush your teeth and be gentle when you floss. Contact a health care provider if:  You are unsure if you are in labor or if your water has broken.  You become dizzy.  You have mild pelvic cramps, pelvic pressure, or nagging pain in your abdominal area.  You have lower back pain.  You have persistent nausea, vomiting, or diarrhea.   You have an unusual or bad smelling vaginal discharge.  You have pain when you urinate. Get help right away if:  Your water breaks before 37 weeks.  You have regular contractions less than 5 minutes apart before 37 weeks.  You have a fever.  You are leaking fluid from your vagina.  You have spotting or bleeding from your vagina.  You have severe abdominal pain or cramping.  You have rapid weight loss or weight gain.  You have   shortness of breath with chest pain.  You notice sudden or extreme swelling of your face, hands, ankles, feet, or legs.  Your baby makes fewer than 10 movements in 2 hours.  You have severe headaches that do not go away when you take medicine.  You have vision changes. Summary  The third trimester is from week 28 through week 40, months 7 through 9. The third trimester is a time when the unborn baby (fetus) is growing rapidly.  During the third trimester, your discomfort may increase as you and your baby continue to gain weight. You may have abdominal, leg, and back pain, sleeping problems, and an increased need to urinate.  During the third trimester your breasts will keep growing and they will continue to become tender. A yellow fluid (colostrum) may leak from your breasts. This is the first milk you are producing for your baby.  False labor is a condition in which you feel small, irregular tightenings of the muscles in the womb (contractions) that eventually go away. These are called Braxton Hicks contractions. Contractions may last for hours, days, or even weeks before true labor sets in.  Signs of labor can include: abdominal cramps; regular contractions that start at 10 minutes apart and become stronger and more frequent with time; watery or bloody mucus discharge that comes from the vagina; increased pelvic pressure and dull back pain; and leaking of amniotic fluid. This information is not intended to replace advice given to you by your health  care provider. Make sure you discuss any questions you have with your health care provider. Document Released: 08/28/2001 Document Revised: 12/25/2018 Document Reviewed: 10/09/2016 Elsevier Patient Education  2020 [employer]  

## 2019-07-29 ENCOUNTER — Telehealth: Payer: Self-pay | Admitting: Women's Health

## 2019-07-29 NOTE — Telephone Encounter (Signed)

## 2019-07-30 ENCOUNTER — Ambulatory Visit (INDEPENDENT_AMBULATORY_CARE_PROVIDER_SITE_OTHER): Payer: 59 | Admitting: Obstetrics & Gynecology

## 2019-07-30 ENCOUNTER — Encounter: Payer: Self-pay | Admitting: Obstetrics & Gynecology

## 2019-07-30 ENCOUNTER — Other Ambulatory Visit: Payer: Self-pay

## 2019-07-30 VITALS — BP 128/89 | HR 115 | Wt 259.0 lb

## 2019-07-30 DIAGNOSIS — Z3A36 36 weeks gestation of pregnancy: Secondary | ICD-10-CM

## 2019-07-30 DIAGNOSIS — Z3403 Encounter for supervision of normal first pregnancy, third trimester: Secondary | ICD-10-CM

## 2019-07-30 DIAGNOSIS — Z1389 Encounter for screening for other disorder: Secondary | ICD-10-CM

## 2019-07-30 DIAGNOSIS — Z331 Pregnant state, incidental: Secondary | ICD-10-CM

## 2019-07-30 DIAGNOSIS — Z3483 Encounter for supervision of other normal pregnancy, third trimester: Secondary | ICD-10-CM

## 2019-07-30 LAB — POCT URINALYSIS DIPSTICK OB
Blood, UA: NEGATIVE
Glucose, UA: NEGATIVE
Ketones, UA: NEGATIVE
Leukocytes, UA: NEGATIVE
Nitrite, UA: NEGATIVE

## 2019-07-30 NOTE — Progress Notes (Signed)
   LOW-RISK PREGNANCY VISIT Patient name: Helen Sims MRN [id number]  Date of birth: [birth year] Chief Complaint:   Routine Prenatal Visit (gbs-gc-chl)  History of Present Illness:   Helen Sims is a 18 y.o. G1P0 female at [redacted]w[redacted]d with an Estimated Date of Delivery: 08/22/19 being seen today for ongoing management of a low-risk pregnancy.  Today she reports no complaints. Contractions: Regular.  .  Movement: Present. denies leaking of fluid. Review of Systems:   Pertinent items are noted in HPI Denies abnormal vaginal discharge w/ itching/odor/irritation, headaches, visual changes, shortness of breath, chest pain, abdominal pain, severe nausea/vomiting, or problems with urination or bowel movements unless otherwise stated above. Pertinent History Reviewed:  Reviewed past medical,surgical, social, obstetrical and family history.  Reviewed problem list, medications and allergies. Physical Assessment:   Vitals:   07/30/19 1410  BP: 128/89  Pulse: (!) 115  Weight: 259 lb (117.5 kg)  Body mass index is 45.88 kg/m.        Physical Examination:   General appearance: Well appearing, and in no distress  Mental status: Alert, oriented to person, place, and time  Skin: Warm & dry  Cardiovascular: Normal heart rate noted  Respiratory: Normal respiratory effort, no distress  Abdomen: Soft, gravid, nontender  Pelvic: Cervical exam performed  Dilation: Closed Effacement (%): Thick Station: -3  Extremities: Edema: None  Fetal Status: Fetal Heart Rate (bpm): 144 Fundal Height: 39 cm Movement: Present Presentation: Vertex  Chaperone: Alice Rieger    Results for orders placed or performed in visit on 07/30/19 (from the past 24 hour(s))  POC Urinalysis [facility]   Collection Time: 07/30/19  2:17 PM  Result Value Ref Range   Color, UA     Clarity, UA     Glucose, UA Negative Negative   Bilirubin, UA     Ketones, UA neg    Spec Grav, UA     Blood, UA neg    pH, UA     POC,PROTEIN,UA Trace Negative, Trace, Small (1+), Moderate (2+), Large (3+), 4+   Urobilinogen, UA     Nitrite, UA neg    Leukocytes, UA Negative Negative   Appearance     Odor      Assessment & Plan:  1) Low-risk pregnancy G1P0 at [redacted]w[redacted]d with an Estimated Date of Delivery: 08/22/19   Chaperone Tish cresenzo RN   Meds: No orders of the defined types were placed in this encounter.  Labs/procedures today: cultures done  Plan:  Continue routine obstetrical care  Next visit: prefers in person    Reviewed: Term labor symptoms and general obstetric precautions including but not limited to vaginal bleeding, contractions, leaking of fluid and fetal movement were reviewed in detail with the patient.  All questions were answered.  home bp cuff. Rx faxed to . Check bp weekly, let us know if >140/90.   Follow-up: Return in about 1 week (around 08/06/2019) for [place].  Orders Placed This Encounter  Procedures  . Strep Gp B NAA  . GC/Chlamydia Probe Amp  . POC Urinalysis [facility]   Florian Buff  07/30/2019 2:37 PM

## 2019-08-01 LAB — STREP GP B NAA: Strep Gp B NAA: NEGATIVE

## 2019-08-01 LAB — GC/CHLAMYDIA PROBE AMP
Chlamydia trachomatis, NAA: NEGATIVE
Neisseria Gonorrhoeae by PCR: NEGATIVE

## 2019-08-05 ENCOUNTER — Telehealth: Payer: Self-pay | Admitting: Obstetrics & Gynecology

## 2019-08-05 NOTE — Telephone Encounter (Signed)

## 2019-08-06 ENCOUNTER — Ambulatory Visit (INDEPENDENT_AMBULATORY_CARE_PROVIDER_SITE_OTHER): Payer: 59 | Admitting: Obstetrics & Gynecology

## 2019-08-06 ENCOUNTER — Encounter: Payer: Self-pay | Admitting: Obstetrics & Gynecology

## 2019-08-06 ENCOUNTER — Other Ambulatory Visit: Payer: Self-pay

## 2019-08-06 VITALS — BP 126/88 | HR 100 | Wt 257.0 lb

## 2019-08-06 DIAGNOSIS — Z3403 Encounter for supervision of normal first pregnancy, third trimester: Secondary | ICD-10-CM

## 2019-08-06 DIAGNOSIS — Z3A37 37 weeks gestation of pregnancy: Secondary | ICD-10-CM

## 2019-08-06 NOTE — Progress Notes (Signed)
   LOW-RISK PREGNANCY VISIT Patient name: Helen Sims MRN [id number]  Date of birth: Aug 04, 2001 Chief Complaint:   Routine Prenatal Visit  History of Present Illness:   Helen Sims is a 18 y.o. G1P0 female at [redacted]w[redacted]d with an Estimated Date of Delivery: 08/22/19 being seen today for ongoing management of a low-risk pregnancy.  Today she reports no complaints. Contractions: Irregular. Vag. Bleeding: None.  Movement: Present. denies leaking of fluid. Review of Systems:   Pertinent items are noted in HPI Denies abnormal vaginal discharge w/ itching/odor/irritation, headaches, visual changes, shortness of breath, chest pain, abdominal pain, severe nausea/vomiting, or problems with urination or bowel movements unless otherwise stated above. Pertinent History Reviewed:  Reviewed past medical,surgical, social, obstetrical and family history.  Reviewed problem list, medications and allergies. Physical Assessment:   Vitals:   08/06/19 1154  BP: 126/88  Pulse: 100  Weight: 257 lb (116.6 kg)  Body mass index is 45.53 kg/m.        Physical Examination:   General appearance: Well appearing, and in no distress  Mental status: Alert, oriented to person, place, and time  Skin: Warm & dry  Cardiovascular: Normal heart rate noted  Respiratory: Normal respiratory effort, no distress  Abdomen: Soft, gravid, nontender  Pelvic: Cervical exam performed  Dilation: 2 Effacement (%): Thick Station: -3  Extremities: Edema: None  Fetal Status: Fetal Heart Rate (bpm): 145 Fundal Height: 37 cm Movement: Present Presentation: Vertex  Chaperone: Amanda Rash    No results found for this or any previous visit (from the past 24 hour(s)).  Assessment & Plan:  1) Low-risk pregnancy G1P0 at [redacted]w[redacted]d with an Estimated Date of Delivery: 08/22/19      Meds: No orders of the defined types were placed in this encounter.  Labs/procedures today:   Plan:  Continue routine obstetrical care  Next visit: prefers in  person    Reviewed: Term labor symptoms and general obstetric precautions including but not limited to vaginal bleeding, contractions, leaking of fluid and fetal movement were reviewed in detail with the patient.  All questions were answered.  home bp cuff. Rx faxed to . Check bp weekly, let us know if >140/90.   Follow-up: No follow-ups on file.  No orders of the defined types were placed in this encounter.  Florian Buff  08/06/2019 12:10 PM

## 2019-08-10 ENCOUNTER — Encounter: Payer: Self-pay | Admitting: Women's Health

## 2019-08-10 ENCOUNTER — Encounter (HOSPITAL_COMMUNITY): Payer: Self-pay

## 2019-08-10 ENCOUNTER — Inpatient Hospital Stay (HOSPITAL_COMMUNITY)
Admission: AD | Admit: 2019-08-10 | Discharge: 2019-08-13 | DRG: 807 | Disposition: A | Discharge status: Home / Self Care | Payer: 59 | Attending: Family Medicine | Admitting: Family Medicine

## 2019-08-10 ENCOUNTER — Ambulatory Visit (INDEPENDENT_AMBULATORY_CARE_PROVIDER_SITE_OTHER): Payer: 59 | Admitting: Women's Health

## 2019-08-10 ENCOUNTER — Other Ambulatory Visit: Payer: Self-pay

## 2019-08-10 VITALS — BP 133/93 | HR 112 | Wt 260.0 lb

## 2019-08-10 DIAGNOSIS — O99214 Obesity complicating childbirth: Secondary | ICD-10-CM | POA: Diagnosis present

## 2019-08-10 DIAGNOSIS — O133 Gestational [pregnancy-induced] hypertension without significant proteinuria, third trimester: Secondary | ICD-10-CM | POA: Diagnosis not present

## 2019-08-10 DIAGNOSIS — Z20828 Contact with and (suspected) exposure to other viral communicable diseases: Secondary | ICD-10-CM | POA: Diagnosis present

## 2019-08-10 DIAGNOSIS — R030 Elevated blood-pressure reading, without diagnosis of hypertension: Secondary | ICD-10-CM

## 2019-08-10 DIAGNOSIS — Z331 Pregnant state, incidental: Secondary | ICD-10-CM

## 2019-08-10 DIAGNOSIS — O134 Gestational [pregnancy-induced] hypertension without significant proteinuria, complicating childbirth: Secondary | ICD-10-CM | POA: Diagnosis present

## 2019-08-10 DIAGNOSIS — Z3403 Encounter for supervision of normal first pregnancy, third trimester: Secondary | ICD-10-CM

## 2019-08-10 DIAGNOSIS — Z3A38 38 weeks gestation of pregnancy: Secondary | ICD-10-CM

## 2019-08-10 DIAGNOSIS — O99824 Streptococcus B carrier state complicating childbirth: Secondary | ICD-10-CM | POA: Diagnosis present

## 2019-08-10 DIAGNOSIS — Z1389 Encounter for screening for other disorder: Secondary | ICD-10-CM

## 2019-08-10 DIAGNOSIS — O139 Gestational [pregnancy-induced] hypertension without significant proteinuria, unspecified trimester: Secondary | ICD-10-CM

## 2019-08-10 LAB — PROTEIN / CREATININE RATIO, URINE
Creatinine, Urine: 192.82 mg/dL
Protein Creatinine Ratio: 0.11 mg/mg{Cre} (ref 0.00–0.15)
Total Protein, Urine: 21 mg/dL

## 2019-08-10 LAB — CBC
HCT: 38.1 % (ref 36.0–46.0)
Hemoglobin: 12.3 g/dL (ref 12.0–15.0)
MCH: 26.2 pg (ref 26.0–34.0)
MCHC: 32.3 g/dL (ref 30.0–36.0)
MCV: 81.1 fL (ref 80.0–100.0)
Platelets: 368 10*3/uL (ref 150–400)
RBC: 4.7 MIL/uL (ref 3.87–5.11)
RDW: 13.7 % (ref 11.5–15.5)
WBC: 14.8 10*3/uL — ABNORMAL HIGH (ref 4.0–10.5)
nRBC: 0 % (ref 0.0–0.2)

## 2019-08-10 LAB — POCT URINALYSIS DIPSTICK OB
Blood, UA: NEGATIVE
Glucose, UA: NEGATIVE
Ketones, UA: NEGATIVE
Leukocytes, UA: NEGATIVE
Nitrite, UA: NEGATIVE
POC,PROTEIN,UA: NEGATIVE

## 2019-08-10 LAB — URINALYSIS, ROUTINE W REFLEX MICROSCOPIC
Bilirubin Urine: NEGATIVE
Glucose, UA: NEGATIVE mg/dL
Hgb urine dipstick: NEGATIVE
Ketones, ur: NEGATIVE mg/dL
Nitrite: NEGATIVE
Protein, ur: 30 mg/dL — AB
Specific Gravity, Urine: 1.026 (ref 1.005–1.030)
pH: 5 (ref 5.0–8.0)

## 2019-08-10 LAB — COMPREHENSIVE METABOLIC PANEL
ALT: 19 U/L (ref 0–44)
AST: 18 U/L (ref 15–41)
Albumin: 2.6 g/dL — ABNORMAL LOW (ref 3.5–5.0)
Alkaline Phosphatase: 97 U/L (ref 38–126)
Anion gap: 9 (ref 5–15)
BUN: 9 mg/dL (ref 6–20)
CO2: 21 mmol/L — ABNORMAL LOW (ref 22–32)
Calcium: 8.7 mg/dL — ABNORMAL LOW (ref 8.9–10.3)
Chloride: 106 mmol/L (ref 98–111)
Creatinine, Ser: 0.54 mg/dL (ref 0.44–1.00)
GFR calc Af Amer: >60 mL/min (ref >60)
GFR calc non Af Amer: >60 mL/min (ref >60)
Glucose, Bld: 108 mg/dL — ABNORMAL HIGH (ref 70–99)
Potassium: 3.8 mmol/L (ref 3.5–5.1)
Sodium: 136 mmol/L (ref 135–145)
Total Bilirubin: 0.3 mg/dL (ref 0.3–1.2)
Total Protein: 6.2 g/dL — ABNORMAL LOW (ref 6.5–8.1)

## 2019-08-10 LAB — SARS CORONAVIRUS 2 BY RT PCR (HOSPITAL ORDER, PERFORMED IN ~~LOC~~ HOSPITAL LAB): SARS Coronavirus 2: NEGATIVE

## 2019-08-10 LAB — TYPE AND SCREEN
ABO/RH(D): B POS
Antibody Screen: NEGATIVE

## 2019-08-10 LAB — ABO/RH: ABO/RH(D): B POS

## 2019-08-10 MED ORDER — ACETAMINOPHEN 325 MG PO TABS: 650.0000 mg | ORAL_TABLET | ORAL | Status: DC | PRN

## 2019-08-10 MED ORDER — OXYTOCIN BOLUS FROM INFUSION
500.0000 mL | Freq: Once | INTRAVENOUS | Status: AC
  Administered 2019-08-11: 500 mL via INTRAVENOUS

## 2019-08-10 MED ORDER — LIDOCAINE HCL (PF) 1 % IJ SOLN
30.0000 mL | INTRAMUSCULAR | Status: AC | PRN
  Administered 2019-08-11: 30 mL via SUBCUTANEOUS
  Filled 2019-08-10: qty 30

## 2019-08-10 MED ORDER — LACTATED RINGERS IV SOLN
INTRAVENOUS | Status: DC
  Administered 2019-08-10 – 2019-08-11 (×2): via INTRAVENOUS

## 2019-08-10 MED ORDER — MISOPROSTOL 50MCG HALF TABLET
50.0000 ug | ORAL_TABLET | ORAL | Status: DC | PRN
  Administered 2019-08-10 – 2019-08-11 (×3): 50 ug via BUCCAL
  Filled 2019-08-10 (×3): qty 1

## 2019-08-10 MED ORDER — LACTATED RINGERS IV SOLN: 500.0000 mL | INTRAVENOUS | Status: DC | PRN

## 2019-08-10 MED ORDER — OXYTOCIN 40 UNITS IN NORMAL SALINE INFUSION - SIMPLE MED
2.5000 [IU]/h | INTRAVENOUS | Status: DC
  Filled 2019-08-10: qty 1000

## 2019-08-10 MED ORDER — OXYCODONE-ACETAMINOPHEN 5-325 MG PO TABS: 2.0000 | ORAL_TABLET | ORAL | Status: DC | PRN

## 2019-08-10 MED ORDER — OXYCODONE-ACETAMINOPHEN 5-325 MG PO TABS: 1.0000 | ORAL_TABLET | ORAL | Status: DC | PRN

## 2019-08-10 MED ORDER — ONDANSETRON HCL 4 MG/2ML IJ SOLN: 4.0000 mg | Freq: Four times a day (QID) | INTRAMUSCULAR | Status: DC | PRN

## 2019-08-10 MED ORDER — SOD CITRATE-CITRIC ACID 500-334 MG/5ML PO SOLN: 30.0000 mL | ORAL | Status: DC | PRN

## 2019-08-10 NOTE — H&P (Addendum)
ADMISSION/ HISTORY & PHYSICAL:  Admission Date: 08/10/2019  4:08 PM  Admit Diagnosis: HBP    Helen Sims is a 18 y.o. G1P0 female at [redacted]w[redacted]d presenting for IOL for gHTN.  Prenatal History: G1P0   EDC : 08/22/2019, by Last Menstrual Period  Prenatal care at [facility] since 11+[redacted]wks gestation  Prenatal course complicated by: Gestational hypertension  Prenatal Labs: ABO, Rh: B (06/24 1626)  Antibody: NEG (11/23 1705) Rubella: 3.85 (06/24 1626)  RPR: Non Reactive (10/07 0908)  HBsAg: Negative (06/24 1626)  HIV: Non Reactive (10/07 0908)  GBS: --Theda Sers (11/12 0000)  2 hr GTT: 88/126/106 Genetic Screening: NT/IT declined, AFP declined, MaterniT21 declined Ultrasound: Normal anatomy scan with no anomalies noted    Maternal Diabetes: No Genetic Screening: Declined Maternal Ultrasounds/Referrals: Normal Fetal Ultrasounds or other Referrals:  None Maternal Substance Abuse:  No Significant Maternal Medications:  None Significant Maternal Lab Results:  Group B Strep negative Other Comments:  None  Medical / Surgical History :  Past medical history:  Past Medical History:  Diagnosis Date  . ADHD (attention deficit hyperactivity disorder)   . Insomnia      Past surgical history:  Past Surgical History:  Procedure Laterality Date  . tubes in ears       Family History:  Family History  Problem Relation Age of Onset  . Hypertension Father   . Hypertension Mother      Social History:  reports that she has never smoked. She has never used smokeless tobacco. She reports that she does not drink alcohol or use drugs.   Allergies: Patient has no known allergies.   Current Medications at time of admission:  Medications Prior to Admission  Medication Sig Dispense Refill Last Dose  . Blood Pressure Monitor MISC For regular home bp monitoring during pregnancy 1 each 0 Past Week at Unknown time  . Prenatal Vit-Fe Fumarate-FA (PRENATAL VITAMIN) 27-0.8 MG TABS Take 1  tablet by mouth daily. 30 tablet 12 08/09/2019 at Unknown time     Review of Systems: Denies LOF, vaginal bleeding, vaginal discharge. Denies abdominal pain, nausea, vomiting. Denies RUQ pain, visual disturbances, headache.  Physical Exam: Vital signs and nursing notes reviewed.  ED Triage Vitals  Enc Vitals Group     BP 08/10/19 1642 (!) 138/103     Pulse Rate 08/10/19 1642 (!) 136     Resp 08/10/19 1642 18     Temp 08/10/19 1642 98.3 F (36.8 C)     Temp Source 08/10/19 1642 Oral     SpO2 08/10/19 1642 96 %     Weight 08/10/19 1636 259 lb 12.8 oz (117.8 kg)     Height 08/10/19 1642 5\' 3"  (1.6 m)     Head Circumference --      Peak Flow --      Pain Score 08/10/19 1650 2     Pain Loc --      Pain Edu? --      Excl. in GC? --      General: AAO x 3, NAD, coping well Heart: RRR Lungs:CTAB Abdomen: Gravid, NT Extremities: No edema Genitalia / VE: Dilation: 2 Effacement (%): Thick Station: -3 Presentation: Vertex Exam by:: brandy, scnm   FHR: 150 BPM, moderate variability, present accels, no decels TOCO: Ctx q 6-8  Labs:   Pending T&S, CBC, RPR  Recent Labs    08/10/19 1702  WBC 14.8*  HGB 12.3  HCT 38.1  PLT 368  Assessment: 18 y.o. G1P0 female at [redacted]w[redacted]d presenting for IOL for gHTN  Not in labor FHR category 1 GBS negative Gestational hypertension - New onset, mild elevation, no severe signs or symptoms, PEC labs normal 08/10/2019 Bottle feeding planned Placenta disposal - to L&D to be discarded  Plan:  Admit to BS Routine L&D orders Planning for natural, unmedicated labor  Buccal Cytotec now - Consider foley balloon placement. Plan for Pitocin augmentation per protocol following cervical ripening Anticipate NSVB  Dr. Rip Harbour notified of admission / plan of care   [place], BSN 08/10/2019, 8:01 PM   I confirm that I have verified the information documented in the nurse midwife student's note and that I have also personally  reperformed the history, physical exam and all medical decision making activities of this service and have verified that all service and findings are accurately documented in this student's note.    Mallie Snooks, CNM 08/10/2019 8:16 PM

## 2019-08-10 NOTE — MAU Provider Note (Addendum)
History     CSN: [id number]  Arrival date and time: 08/10/19 1608   First Provider Initiated Contact with Patient 08/10/19 1705      Chief Complaint  Patient presents with  . Hypertension   18 y.o. G1 @38 .2 wks sent from office for elevated BP. Denies HA, visual disturbances, RUQ pain, SOB, and CP. Reports good FM. No VB, LOF, or ctx.    OB History    Gravida  1   Para      Term      Preterm      AB      Living        SAB      TAB      Ectopic      Multiple      Live Births              Past Medical History:  Diagnosis Date  . ADHD (attention deficit hyperactivity disorder)   . Insomnia     Past Surgical History:  Procedure Laterality Date  . tubes in ears      Family History  Problem Relation Age of Onset  . Hypertension Father   . Hypertension Mother     Social History   Tobacco Use  . Smoking status: Never Smoker  . Smokeless tobacco: Never Used  Substance Use Topics  . Alcohol use: No  . Drug use: No    Allergies: No Known Allergies  Medications Prior to Admission  Medication Sig Dispense Refill Last Dose  . Blood Pressure Monitor MISC For regular home bp monitoring during pregnancy 1 each 0 Past Week at Unknown time  . Prenatal Vit-Fe Fumarate-FA (PRENATAL VITAMIN) 27-0.8 MG TABS Take 1 tablet by mouth daily. 30 tablet 12 08/09/2019 at Unknown time    Review of Systems  Eyes: Negative for visual disturbance.  Respiratory: Negative for shortness of breath.   Cardiovascular: Negative for chest pain.  Gastrointestinal: Negative for abdominal pain.  Genitourinary: Negative for vaginal bleeding and vaginal discharge.  Neurological: Negative for headaches.   Physical Exam   Blood pressure (!) 143/85, pulse (!) 122, temperature 98.3 F (36.8 C), temperature source Oral, resp. rate 18, height 5\' 3"  (1.6 m), weight 117.8 kg, last menstrual period 11/15/2018, SpO2 96 %.  Patient Vitals for the past 24 hrs:  BP Temp Temp src  Pulse Resp SpO2 Height Weight  08/10/19 1846 (!) 143/85 - - (!) 122 - - - -  08/10/19 1831 (!) 141/91 - - (!) 120 - - - -  08/10/19 1805 (!) 144/93 - - (!) 125 - 96 % - -  08/10/19 1801 (!) 142/125 - - (!) 113 - - - -  08/10/19 1746 (!) 138/51 - - (!) 121 - - - -  08/10/19 1731 132/64 - - (!) 124 - - - -  08/10/19 1716 132/66 - - (!) 121 - - - -  08/10/19 1701 126/73 - - (!) 117 - - - -  08/10/19 1646 132/89 - - (!) 132 - - - -  08/10/19 1642 (!) 138/103 98.3 F (36.8 C) Oral (!) 136 18 96 % 5\' 3"  (1.6 m) -  08/10/19 1636 - - - - - - - 117.8 kg    Physical Exam  Nursing note and vitals reviewed. Constitutional: She is oriented to person, place, and time. She appears well-developed and well-nourished. No distress.  HENT:  Head: Normocephalic and atraumatic.  Neck: Normal range of motion.  Cardiovascular: Normal rate.  Respiratory: Effort normal. No respiratory distress.  GI: Soft. She exhibits no distension. There is no abdominal tenderness.  Gravid   Musculoskeletal: Normal range of motion.        General: No edema.  Neurological: She is alert and oriented to person, place, and time.  Skin: Skin is warm and dry.  Psychiatric: She has a normal mood and affect.  EFM: 145 bpm, mod variability, + accels, no decels Toco: rare  Results for orders placed or performed during the hospital encounter of 08/10/19 (from the past 24 hour(s))  Urinalysis, Routine w reflex microscopic     Status: Abnormal   Collection Time: 08/10/19  4:38 PM  Result Value Ref Range   Color, Urine YELLOW YELLOW   APPearance HAZY (A) CLEAR   Specific Gravity, Urine 1.026 1.005 - 1.030   pH 5.0 5.0 - 8.0   Glucose, UA NEGATIVE NEGATIVE mg/dL   Hgb urine dipstick NEGATIVE NEGATIVE   Bilirubin Urine NEGATIVE NEGATIVE   Ketones, ur NEGATIVE NEGATIVE mg/dL   Protein, ur 30 (A) NEGATIVE mg/dL   Nitrite NEGATIVE NEGATIVE   Leukocytes,Ua SMALL (A) NEGATIVE   RBC / HPF 0-5 0 - 5 RBC/hpf   WBC, UA 6-10 0 - 5  WBC/hpf   Bacteria, UA RARE (A) NONE SEEN   Squamous Epithelial / LPF 0-5 0 - 5   Mucus PRESENT   Protein / creatinine ratio, urine     Status: None   Collection Time: 08/10/19  4:54 PM  Result Value Ref Range   Creatinine, Urine 192.82 mg/dL   Total Protein, Urine 21 mg/dL   Protein Creatinine Ratio 0.11 0.00 - 0.15 mg/mg[Cre]  CBC     Status: Abnormal   Collection Time: 08/10/19  5:02 PM  Result Value Ref Range   WBC 14.8 (H) 4.0 - 10.5 K/uL   RBC 4.70 3.87 - 5.11 MIL/uL   Hemoglobin 12.3 12.0 - 15.0 g/dL   HCT 16.138.1 09.636.0 - 04.546.0 %   MCV 81.1 80.0 - 100.0 fL   MCH 26.2 26.0 - 34.0 pg   MCHC 32.3 30.0 - 36.0 g/dL   RDW 40.913.7 81.111.5 - 91.415.5 %   Platelets 368 150 - 400 K/uL   nRBC 0.0 0.0 - 0.2 %  Comprehensive metabolic panel     Status: Abnormal   Collection Time: 08/10/19  5:02 PM  Result Value Ref Range   Sodium 136 135 - 145 mmol/L   Potassium 3.8 3.5 - 5.1 mmol/L   Chloride 106 98 - 111 mmol/L   CO2 21 (L) 22 - 32 mmol/L   Glucose, Bld 108 (H) 70 - 99 mg/dL   BUN 9 6 - 20 mg/dL   Creatinine, Ser 7.820.54 0.44 - 1.00 mg/dL   Calcium 8.7 (L) 8.9 - 10.3 mg/dL   Total Protein 6.2 (L) 6.5 - 8.1 g/dL   Albumin 2.6 (L) 3.5 - 5.0 g/dL   AST 18 15 - 41 U/L   ALT 19 0 - 44 U/L   Alkaline Phosphatase 97 38 - 126 U/L   Total Bilirubin 0.3 0.3 - 1.2 mg/dL   GFR calc non Af Amer >60 >60 mL/min   GFR calc Af Amer >60 >60 mL/min   Anion gap 9 5 - 15   MAU Course  Procedures  MDM Labs ordered and reviewed. Meets criteria for gHTN. Plan for admit.  Assessment and Plan  [redacted] weeks gestation Reactive NST gHTN Admit to LD Mngt per labor team- notified  Helen Sims  08/10/2019, 6:57 PM

## 2019-08-10 NOTE — MAU Note (Signed)
.   Helen Sims is a 18 y.o. at [redacted]w[redacted]d here in MAU reporting: high blood pressure at [facility] and was sent for evaluation. No VB or LOF. + FM. No HA or blurred vision.  Pain score: 2 Vitals:   08/10/19 1642  BP: (!) 138/103  Pulse: (!) 136  Resp: 18  Temp: 98.3 F (36.8 C)  SpO2: 96%     FHT:150

## 2019-08-10 NOTE — Patient Instructions (Signed)
Helen Sims, I greatly value your feedback.  If you receive a survey following your visit with [place] today, we appreciate you taking the time to fill it out.  Thanks, Knute Neu, CNM, [facility]  [place]!!! It is now [place] at [facility] ([place], [place] [address]) Entrance located off of [address] parking   Go to [email or web address] to register for FREE online childbirth classes    Call the office [phone]) or go to [facility] if:  You begin to have strong, frequent contractions  Your water breaks.  Sometimes it is a big gush of fluid, sometimes it is just a trickle that keeps getting your panties wet or running down your legs  You have vaginal bleeding.  It is normal to have a small amount of spotting if your cervix was checked.   You don't feel your baby moving like normal.  If you don't, get you something to eat and drink and lay down and focus on feeling your baby move.  You should feel at least 10 movements in 2 hours.  If you don't, you should call the office or go to [facility]   Call the office [phone]) or go to [facility] hospital for these signs of pre-eclampsia:  Severe headache that does not go away with Tylenol  Visual changes- seeing spots, double, blurred vision  Pain under your right breast or upper abdomen that does not go away with Tums or heartburn medicine  Nausea and/or vomiting  Severe swelling in your hands, feet, and face      Home Blood Pressure Monitoring for Patients   Your provider has recommended that you check your blood pressure (BP) at least once a week at home. If you do not have a blood pressure cuff at home, one will be provided for you. Contact your provider if you have not received your monitor within 1 week.   Helpful Tips for Accurate Home Blood Pressure Checks  . Don't smoke, exercise, or drink caffeine 30 minutes before checking your BP . Use the  restroom before checking your BP (a full bladder can raise your pressure) . Relax in a comfortable upright chair . Feet on the ground . Left arm resting comfortably on a flat surface at the level of your heart . Legs uncrossed . Back supported . Sit quietly and don't talk . Place the cuff on your bare arm . Adjust snuggly, so that only two fingertips can fit between your skin and the top of the cuff . Check 2 readings separated by at least one minute . Keep a log of your BP readings . For a visual, please reference this diagram: http://ccnc.care/bpdiagram  Provider Name: [facility]     Phone: [phone]  Zone 1: ALL CLEAR  Continue to monitor your symptoms:  . BP reading is less than 140 (top number) or less than 90 (bottom number)  . No right upper stomach pain . No headaches or seeing spots . No feeling nauseated or throwing up . No swelling in face and hands  Zone 2: CAUTION Call your doctor's office for any of the following:  . BP reading is greater than 140 (top number) or greater than 90 (bottom number)  . Stomach pain under your ribs in the middle or right side . Headaches or seeing spots . Feeling nauseated or throwing up . Swelling in face and hands  Zone 3: EMERGENCY  Seek  immediate medical care if you have any of the following:  . BP reading is greater than160 (top number) or greater than 110 (bottom number) . Severe headaches not improving with Tylenol . Serious difficulty catching your breath . Any worsening symptoms from Zone 2

## 2019-08-10 NOTE — Progress Notes (Signed)
   Work-in LOW-RISK PREGNANCY VISIT Patient name: Helen Sims MRN [id number]  Date of birth: July 27, 2001 Chief Complaint:   Pressure and Back Pain  History of Present Illness:   Helen Sims is a 18 y.o. G1P0 female at [redacted]w[redacted]d with an Estimated Date of Delivery: 08/22/19 being seen today for ongoing management of a low-risk pregnancy.  Today she reports pressure, intermittent contractions.  Denies ha, visual changes, ruq pain, n/v. Does have epigastric irritation she thinks is reflux but hasn't taken anything.  Contractions: Irregular. Vag. Bleeding: None.  Movement: Present. denies leaking of fluid. Review of Systems:   Pertinent items are noted in HPI Denies abnormal vaginal discharge w/ itching/odor/irritation, headaches, visual changes, shortness of breath, chest pain, abdominal pain, severe nausea/vomiting, or problems with urination or bowel movements unless otherwise stated above. Pertinent History Reviewed:  Reviewed past medical,surgical, social, obstetrical and family history.  Reviewed problem list, medications and allergies. Physical Assessment:   Vitals:   08/10/19 1349 08/10/19 1351  BP: 137/90 (!) 133/93  Pulse: (!) 119 (!) 112  Weight: 260 lb (117.9 kg)   Body mass index is 46.06 kg/m.        Physical Examination:   General appearance: Well appearing, and in no distress  Mental status: Alert, oriented to person, place, and time  Skin: Warm & dry  Cardiovascular: Normal heart rate noted  Respiratory: Normal respiratory effort, no distress  Abdomen: Soft, gravid, nontender  Pelvic: Cervical exam performed by Maryagnes Amos, SNM Dilation: 2 Effacement (%): Thick Station: -3  Extremities: Edema: Trace  Fetal Status: Fetal Heart Rate (bpm): 149 Fundal Height: 39 cm Movement: Present Presentation: Vertex  Chaperone: me    Results for orders placed or performed in visit on 08/10/19 (from the past 24 hour(s))  POC Urinalysis Dipstick OB   Collection Time: 08/10/19   1:49 PM  Result Value Ref Range   Color, UA     Clarity, UA     Glucose, UA Negative Negative   Bilirubin, UA     Ketones, UA n    Spec Grav, UA     Blood, UA n    pH, UA     POC,PROTEIN,UA Negative Negative, Trace, Small (1+), Moderate (2+), Large (3+), 4+   Urobilinogen, UA     Nitrite, UA n    Leukocytes, UA Negative Negative   Appearance     Odor      Assessment & Plan:  1) Low-risk pregnancy G1P0 at [redacted]w[redacted]d with an Estimated Date of Delivery: 08/22/19   2) Elevated bp, will send to MAU for pre-e eval, notified Jorje Guild, NP   Meds: No orders of the defined types were placed in this encounter.  Labs/procedures today: sve  Follow-up: Return for As scheduled. Wed in case d/c'd  Orders Placed This Encounter  Procedures  . POC Urinalysis Dipstick OB   Roma Schanz CNM, [facility] 08/10/2019 2:14 PM

## 2019-08-11 ENCOUNTER — Inpatient Hospital Stay (HOSPITAL_COMMUNITY): Payer: 59 | Admitting: Anesthesiology

## 2019-08-11 ENCOUNTER — Encounter (HOSPITAL_COMMUNITY): Payer: Self-pay | Admitting: *Deleted

## 2019-08-11 DIAGNOSIS — O139 Gestational [pregnancy-induced] hypertension without significant proteinuria, unspecified trimester: Secondary | ICD-10-CM

## 2019-08-11 DIAGNOSIS — O134 Gestational [pregnancy-induced] hypertension without significant proteinuria, complicating childbirth: Secondary | ICD-10-CM

## 2019-08-11 DIAGNOSIS — Z3A38 38 weeks gestation of pregnancy: Secondary | ICD-10-CM

## 2019-08-11 DIAGNOSIS — O99824 Streptococcus B carrier state complicating childbirth: Secondary | ICD-10-CM

## 2019-08-11 LAB — CBC
HCT: 38.1 % (ref 36.0–46.0)
Hemoglobin: 12 g/dL (ref 12.0–15.0)
MCH: 25.9 pg — ABNORMAL LOW (ref 26.0–34.0)
MCHC: 31.5 g/dL (ref 30.0–36.0)
MCV: 82.3 fL (ref 80.0–100.0)
Platelets: 361 10*3/uL (ref 150–400)
RBC: 4.63 MIL/uL (ref 3.87–5.11)
RDW: 14 % (ref 11.5–15.5)
WBC: 17.9 10*3/uL — ABNORMAL HIGH (ref 4.0–10.5)
nRBC: 0 % (ref 0.0–0.2)

## 2019-08-11 LAB — RPR: RPR Ser Ql: NONREACTIVE

## 2019-08-11 MED ORDER — LIDOCAINE HCL (PF) 1 % IJ SOLN
INTRAMUSCULAR | Status: DC | PRN
  Administered 2019-08-11 (×2): 4 mL via EPIDURAL

## 2019-08-11 MED ORDER — FENTANYL-BUPIVACAINE-NACL 0.5-0.125-0.9 MG/250ML-% EP SOLN
12.0000 mL/h | EPIDURAL | Status: DC | PRN
  Filled 2019-08-11: qty 250

## 2019-08-11 MED ORDER — OXYTOCIN 40 UNITS IN NORMAL SALINE INFUSION - SIMPLE MED
1.0000 m[IU]/min | INTRAVENOUS | Status: DC
  Administered 2019-08-11: 2 m[IU]/min via INTRAVENOUS

## 2019-08-11 MED ORDER — PHENYLEPHRINE 40 MCG/ML (10ML) SYRINGE FOR IV PUSH (FOR BLOOD PRESSURE SUPPORT): 80.0000 ug | PREFILLED_SYRINGE | INTRAVENOUS | Status: DC | PRN

## 2019-08-11 MED ORDER — DIBUCAINE (PERIANAL) 1 % EX OINT: 1.0000 "application " | TOPICAL_OINTMENT | CUTANEOUS | Status: DC | PRN

## 2019-08-11 MED ORDER — EPHEDRINE 5 MG/ML INJ: 10.0000 mg | INTRAVENOUS | Status: DC | PRN

## 2019-08-11 MED ORDER — ACETAMINOPHEN 325 MG PO TABS
650.0000 mg | ORAL_TABLET | ORAL | Status: DC | PRN
  Administered 2019-08-11: 650 mg via ORAL
  Filled 2019-08-11: qty 2

## 2019-08-11 MED ORDER — FENTANYL CITRATE (PF) 100 MCG/2ML IJ SOLN
INTRAMUSCULAR | Status: AC
  Filled 2019-08-11: qty 2

## 2019-08-11 MED ORDER — CEFAZOLIN SODIUM-DEXTROSE 2-4 GM/100ML-% IV SOLN
2.0000 g | Freq: Once | INTRAVENOUS | Status: AC
  Administered 2019-08-11: 2 g via INTRAVENOUS
  Filled 2019-08-11: qty 100

## 2019-08-11 MED ORDER — FENTANYL CITRATE (PF) 100 MCG/2ML IJ SOLN
100.0000 ug | INTRAMUSCULAR | Status: DC | PRN
  Administered 2019-08-11 (×4): 100 ug via INTRAVENOUS
  Filled 2019-08-11 (×3): qty 2

## 2019-08-11 MED ORDER — LACTATED RINGERS IV SOLN: 500.0000 mL | Freq: Once | INTRAVENOUS | Status: DC

## 2019-08-11 MED ORDER — IBUPROFEN 600 MG PO TABS
600.0000 mg | ORAL_TABLET | Freq: Four times a day (QID) | ORAL | Status: DC
  Administered 2019-08-12 – 2019-08-13 (×7): 600 mg via ORAL
  Filled 2019-08-11 (×8): qty 1

## 2019-08-11 MED ORDER — WITCH HAZEL-GLYCERIN EX PADS: 1.0000 "application " | MEDICATED_PAD | CUTANEOUS | Status: DC | PRN

## 2019-08-11 MED ORDER — TERBUTALINE SULFATE 1 MG/ML IJ SOLN: 0.2500 mg | Freq: Once | INTRAMUSCULAR | Status: DC | PRN

## 2019-08-11 MED ORDER — SIMETHICONE 80 MG PO CHEW: 80.0000 mg | CHEWABLE_TABLET | ORAL | Status: DC | PRN

## 2019-08-11 MED ORDER — SENNOSIDES-DOCUSATE SODIUM 8.6-50 MG PO TABS
2.0000 | ORAL_TABLET | ORAL | Status: DC
  Administered 2019-08-12 (×2): 2 via ORAL
  Filled 2019-08-11 (×2): qty 2

## 2019-08-11 MED ORDER — PRENATAL MULTIVITAMIN CH
1.0000 | ORAL_TABLET | Freq: Every day | ORAL | Status: DC
  Administered 2019-08-12 – 2019-08-13 (×2): 1 via ORAL
  Filled 2019-08-11 (×2): qty 1

## 2019-08-11 MED ORDER — ONDANSETRON HCL 4 MG/2ML IJ SOLN: 4.0000 mg | INTRAMUSCULAR | Status: DC | PRN

## 2019-08-11 MED ORDER — SODIUM CHLORIDE (PF) 0.9 % IJ SOLN
INTRAMUSCULAR | Status: DC | PRN
  Administered 2019-08-11: 12 mL/h via EPIDURAL

## 2019-08-11 MED ORDER — DIPHENHYDRAMINE HCL 50 MG/ML IJ SOLN: 12.5000 mg | INTRAMUSCULAR | Status: DC | PRN

## 2019-08-11 MED ORDER — COCONUT OIL OIL: 1.0000 "application " | TOPICAL_OIL | Status: DC | PRN

## 2019-08-11 MED ORDER — BENZOCAINE-MENTHOL 20-0.5 % EX AERO
1.0000 "application " | INHALATION_SPRAY | CUTANEOUS | Status: DC | PRN
  Administered 2019-08-12: 1 via TOPICAL
  Filled 2019-08-11: qty 56

## 2019-08-11 MED ORDER — TETANUS-DIPHTH-ACELL PERTUSSIS 5-2.5-18.5 LF-MCG/0.5 IM SUSP: 0.5000 mL | Freq: Once | INTRAMUSCULAR | Status: DC

## 2019-08-11 MED ORDER — ONDANSETRON HCL 4 MG PO TABS: 4.0000 mg | ORAL_TABLET | ORAL | Status: DC | PRN

## 2019-08-11 NOTE — Anesthesia Procedure Notes (Signed)
Epidural Patient location during procedure: OB Start time: 08/11/2019 10:09 AM End time: 08/11/2019 10:12 AM  Staffing Anesthesiologist: Brennan Bailey, MD Performed: anesthesiologist   Preanesthetic Checklist Completed: patient identified, pre-op evaluation, timeout performed, IV checked, risks and benefits discussed and monitors and equipment checked  Epidural Patient position: sitting Prep: site prepped and draped and DuraPrep Patient monitoring: continuous pulse ox, blood pressure and heart rate Approach: midline Location: L3-L4 Injection technique: LOR air  Needle:  Needle type: Tuohy  Needle gauge: 17 G Needle length: 9 cm Needle insertion depth: 7 cm Catheter type: closed end flexible Catheter size: 19 Gauge Catheter at skin depth: 12 cm Test dose: negative and Other (1% lidocaine)  Assessment Events: blood not aspirated, injection not painful, no injection resistance, negative IV test and no paresthesia  Additional Notes Patient identified. Risks, benefits, and alternatives discussed with patient including but not limited to bleeding, infection, nerve damage, paralysis, failed block, incomplete pain control, headache, blood pressure changes, nausea, vomiting, reactions to medication, itching, and postpartum back pain. Confirmed with bedside nurse the patient's most recent platelet count. Confirmed with patient that they are not currently taking any anticoagulation, have any bleeding history, or any family history of bleeding disorders. Patient expressed understanding and wished to proceed. All questions were answered. Sterile technique was used throughout the entire procedure. Please see nursing notes for vital signs.   Crisp LOR on first pass. Test dose was given through epidural catheter and negative prior to continuing to dose epidural or start infusion. Warning signs of high block given to the patient including shortness of breath, tingling/numbness in hands,  complete motor block, or any concerning symptoms with instructions to call for help. Patient was given instructions on fall risk and not to get out of bed. All questions and concerns addressed with instructions to call with any issues or inadequate analgesia.  Reason for block:procedure for pain

## 2019-08-11 NOTE — Progress Notes (Signed)
ALEYNA CUEVA is a 18 y.o. G1P0 at [redacted]w[redacted]d admitted for IOL 2/2 gHTN.  Subjective: No complaints, still feels baby move. SROM at 0200. Pain controlled with IV pain meds.  Objective: BP (!) 143/76   Pulse 95   Temp 98.7 F (37.1 C) (Oral)   Resp 18   Ht 5\' 3"  (1.6 m)   Wt 117.8 kg   LMP 11/15/2018 (Exact Date)   SpO2 97%   BMI 46.02 kg/m  No intake/output data recorded.  FHT:  FHR: 125 bpm, variability: moderate,  accelerations:  Present,  decelerations:  Absent UC:   irregular, every 2-4 minutes  SVE:   Dilation: 2.5 Effacement (%): 50 Station: -3 Exam by:: s seagraves rn   Labs: Lab Results  Component Value Date   WBC 14.8 (H) 08/10/2019   HGB 12.3 08/10/2019   HCT 38.1 08/10/2019   MCV 81.1 08/10/2019   PLT 368 08/10/2019    Assessment / Plan: 18 yo G1P0 at 34.3 EGA, here for IOL 2/2 gHTN  Labor: s/p cyto x3. SROM @ 0200. Consider re-attempt at [facility] at next check. Fetal Wellbeing:  Category I Pain Control:  as desired Gestational hypertension: one severe range pressure (160/101), which resolved without medication I/D:  GBS negative Anticipated MOD:  vaginal, CS as appropriate  Merilyn Baba DO OB Fellow, Faculty [facility] 08/11/2019, 4:19 AM

## 2019-08-11 NOTE — Progress Notes (Signed)
Labor Progress Note Helen Sims is a 18 y.o. G1P0 at [redacted]w[redacted]d presented for IOL for gHTN  S:  Feeling some ctx. Had several doses of Fentanyl.  O:  BP (!) 141/99   Pulse (!) 102   Temp 99 F (37.2 C) (Oral)   Resp 16   Ht 5\' 3"  (1.6 m)   Wt 117.8 kg   LMP 11/15/2018 (Exact Date)   SpO2 97%   BMI 46.02 kg/m  EFM: baseline 135 bpm/ mod variability/ + accels/ no decels  Toco/IUPC: 2-3 SVE: Dilation: 5 Effacement (%): 80 Cervical Position: Posterior Station: -1 Presentation: Vertex Exam by:: Mihaela Fajardo, CNM Clear AF  A/P: 18 y.o. G1P0 [redacted]w[redacted]d  1. Labor: latent 2. FWB: Cat I 3. Pain: analgesia/anesthesia prn 4. gHTN- stable  Recommend Pitocin IOL, pt agrees. Anticipate labor progression and SVD.  Julianne Handler, CNM 9:51 AM

## 2019-08-11 NOTE — Progress Notes (Signed)
Helen Sims is a 18 y.o. G1P0 at [redacted]w[redacted]d admitted for IOL 2/2 gHTN  Subjective: No complaints, feeling baby move, occasional cramps  Objective: BP (!) 143/83   Pulse (!) 117   Temp 98.4 F (36.9 C) (Oral)   Resp 16   Ht 5\' 3"  (1.6 m)   Wt 117.8 kg   LMP 11/15/2018 (Exact Date)   SpO2 97%   BMI 46.02 kg/m  No intake/output data recorded.  FHT:  FHR: 130 bpm, variability: moderate,  accelerations:  Present,  decelerations:  Absent UC:   irregular, every 2-5 minutes  SVE:   Dilation: 2 Effacement (%): Thick Station: -3 Exam by:: Dr. Darene Lamer  Labs: Lab Results  Component Value Date   WBC 14.8 (H) 08/10/2019   HGB 12.3 08/10/2019   HCT 38.1 08/10/2019   MCV 81.1 08/10/2019   PLT 368 08/10/2019    Assessment / Plan: 18 yo G1P0 at 50.3 EGA, here for IOL 2/2 gHTN  Labor: s/p cyto x1. FB attempted, but cervix too high. 2nd dose cyto given. Consider re-attempt at [facility] at next check. Fetal Wellbeing:  Category I Pain Control:  as desired Gestational hypertension: no severe range pressures, most recent 143/83 I/D:  GBS negative Anticipated MOD:  vaginal, CS as appropriate  Merilyn Baba DO OB Fellow, Faculty [facility] 08/11/2019, 12:19 AM

## 2019-08-11 NOTE — Discharge Summary (Signed)
Postpartum Discharge Summary    Patient Name: Helen Sims DOB: [birth year] MRN: [id number]  Date of admission: 08/10/2019 Delivering Provider: Julianne Handler   Date of discharge: 08/13/2019  Admitting diagnosis: HBP Intrauterine pregnancy: [redacted]w[redacted]d    Secondary diagnosis:  Active Problems:   Indication for care in labor or delivery   SVD (spontaneous vaginal delivery)   Gestational hypertension   Retained placenta  Additional problems: none     Discharge diagnosis: Term Pregnancy Delivered, Gestational Hypertension and Retained placenta                                                                                                Post partum procedures: manual extraction of placenta, received ancef x1. Laceration suture.  Augmentation: Pitocin and Cytotec  Complications: retained placenta delivered manual extraction after 32 minutes and sent to pathology.   Hospital course:  Induction of Labor With Vaginal Delivery   18y.o. yo G1P0 at 360w3das admitted to the hospital 08/10/2019 for induction of labor.  Indication for induction: Gestational hypertension.  Patient had an uncomplicated labor course as follows: Membrane Rupture Time/Date: 1:50 AM ,08/11/2019   Intrapartum Procedures: Episiotomy: None [1]                                         Lacerations:  Sulcus [9];Labial [10]  Patient had delivery of a Viable infant.  Information for the patient's newborn:  AdJakita, Dutkiewicz0[[id number]]Delivery Method: Vag-Spont    08/11/2019  Details of delivery can be found in separate delivery note.  Patient had a routine postpartum course. Patient is discharged home 08/13/19. Delivery time: 4:33 PM    Magnesium Sulfate received: No BMZ received: No Rhophylac:N/A MMR:N/A Transfusion:No  Physical exam  Vitals:   08/12/19 0934 08/12/19 1520 08/12/19 2200 08/13/19 0640  BP: 138/64 132/67 125/79 104/64  Pulse:  91 (!) 103 66  Resp: 20 18 18 16   Temp: 97.6 F (36.4 C) 98  F (36.7 C) 98.2 F (36.8 C) 97.8 F (36.6 C)  TempSrc: Oral Oral Oral Oral  SpO2:  99%    Weight:      Height:       General: alert, cooperative and no distress Lochia: appropriate Uterine Fundus: firm Incision: N/A DVT Evaluation: No evidence of DVT seen on physical exam. Labs: Lab Results  Component Value Date   WBC 17.9 (H) 08/11/2019   HGB 12.0 08/11/2019   HCT 38.1 08/11/2019   MCV 82.3 08/11/2019   PLT 361 08/11/2019   CMP Latest Ref Rng & Units 08/10/2019  Glucose 70 - 99 mg/dL 108(H)  BUN 6 - 20 mg/dL 9  Creatinine 0.44 - 1.00 mg/dL 0.54  Sodium 135 - 145 mmol/L 136  Potassium 3.5 - 5.1 mmol/L 3.8  Chloride 98 - 111 mmol/L 106  CO2 22 - 32 mmol/L 21(L)  Calcium 8.9 - 10.3 mg/dL 8.7(L)  Total Protein 6.5 - 8.1 g/dL 6.2(L)  Total Bilirubin 0.3 - 1.2 mg/dL 0.3  Alkaline Phos 38 - 126 U/L 97  AST 15 - 41 U/L 18  ALT 0 - 44 U/L 19    Discharge instruction: per After Visit Summary and "Baby and Me Booklet".  After visit meds:  Allergies as of 08/13/2019   No Known Allergies     Medication List    STOP taking these medications   Blood Pressure Monitor Misc     TAKE these medications   ibuprofen 600 MG tablet Commonly known as: ADVIL Take 1 tablet (600 mg total) by mouth every 6 (six) hours as needed for moderate pain.   Prenatal Vitamin 27-0.8 MG Tabs Take 1 tablet by mouth daily.      Diet: routine diet  Activity: Advance as tolerated. Pelvic rest for 6 weeks.   Outpatient follow up:6 weeks Follow up Appt: Future Appointments  Date Time Provider [place]  08/19/2019 11:30 AM [facility] NURSE [facility]  09/09/2019  1:50 PM Roma Schanz, CNM [facility]   Follow up Visit:  Please schedule this patient for Postpartum visit in: 4 weeks with the following provider: Any provider For C/S patients schedule nurse incision check in weeks 2 weeks: no High risk pregnancy complicated by: HTN Delivery mode:  SVD Anticipated  Birth Control:  other/unsure PP Procedures needed: BP check  Schedule Integrated BH visit: no  Newborn Data: Live born female  Birth Weight: 3435g APGAR: 40, 9  Newborn Delivery   Birth date/time: 08/11/2019 16:33:00 Delivery type: Vaginal, Spontaneous      Baby Feeding: Breast Disposition:home with mother   08/13/2019 Chauncey Mann, MD

## 2019-08-11 NOTE — Anesthesia Preprocedure Evaluation (Addendum)
Anesthesia Evaluation  Patient identified by MRN, date of birth, ID band Patient awake    Reviewed: Allergy & Precautions, Patient's Chart, lab work & pertinent test results  History of Anesthesia Complications Negative for: history of anesthetic complications  Airway Mallampati: II  TM Distance: >3 FB Neck ROM: Full    Dental no notable dental hx.    Pulmonary neg pulmonary ROS,    Pulmonary exam normal        Cardiovascular negative cardio ROS Normal cardiovascular exam     Neuro/Psych negative neurological ROS  negative psych ROS   GI/Hepatic negative GI ROS, Neg liver ROS,   Endo/Other  Morbid obesity  Renal/GU negative Renal ROS  negative genitourinary   Musculoskeletal negative musculoskeletal ROS (+)   Abdominal   Peds  Hematology negative hematology ROS (+)   Anesthesia Other Findings Day of surgery medications reviewed with patient.  Reproductive/Obstetrics (+) Pregnancy gHTN                            Anesthesia Physical Anesthesia Plan  ASA: III  Anesthesia Plan: Epidural   Post-op Pain Management:    Induction:   PONV Risk Score and Plan: Treatment may vary due to age or medical condition  Airway Management Planned: Natural Airway  Additional Equipment:   Intra-op Plan:   Post-operative Plan:   Informed Consent: I have reviewed the patients History and Physical, chart, labs and discussed the procedure including the risks, benefits and alternatives for the proposed anesthesia with the patient or authorized representative who has indicated his/her understanding and acceptance.       Plan Discussed with:   Anesthesia Plan Comments:         Anesthesia Quick Evaluation

## 2019-08-12 ENCOUNTER — Encounter: Payer: 59 | Admitting: Obstetrics and Gynecology

## 2019-08-12 NOTE — Anesthesia Postprocedure Evaluation (Signed)
Anesthesia Post Note  Patient: Helen Sims  Procedure(s) Performed: AN AD [place]     Patient location during evaluation: Mother Baby Anesthesia Type: Epidural Level of consciousness: awake, awake and alert and oriented Pain management: pain level controlled Vital Signs Assessment: post-procedure vital signs reviewed and stable Respiratory status: spontaneous breathing and respiratory function stable Cardiovascular status: blood pressure returned to baseline Postop Assessment: no headache, epidural receding, patient able to bend at knees, adequate PO intake, no backache, no apparent nausea or vomiting and able to ambulate Anesthetic complications: no    Last Vitals:  Vitals:   08/12/19 0003 08/12/19 0358  BP: 129/86 103/78  Pulse: 86 90  Resp: 18 18  Temp: 36.9 C 36.6 C  SpO2:      Last Pain:  Vitals:   08/12/19 0552  TempSrc:   PainSc: 0-No pain   Pain Goal: Patients Stated Pain Goal: 1 (08/11/19 0304)                 Bufford Spikes

## 2019-08-12 NOTE — Progress Notes (Addendum)
POSTPARTUM PROGRESS NOTE  Subjective: Helen Sims is a 18 y.o. G1P1001 s/p Vaginal Delivery at [redacted]w[redacted]d.  She reports she doing well. No acute events overnight. She denies any problems with ambulating, voiding or po intake. Denies nausea or vomiting. She has  passed flatus. Pain is well controlled.  Lochia is appropriate.  Objective: Blood pressure 138/64, pulse 90, temperature 97.6 F (36.4 C), temperature source Oral, resp. rate 20, height 5\' 3"  (1.6 m), weight 117.8 kg, last menstrual period 11/15/2018, SpO2 99 %, unknown if currently breastfeeding.  Physical Exam:  General: alert, cooperative and no distress Chest: no respiratory distress Abdomen: soft, non-tender  Uterine Fundus: firm, appropriately tender Extremities: No calf swelling or tenderness  no edema  Recent Labs    08/10/19 1702 08/11/19 0919  HGB 12.3 12.0  HCT 38.1 38.1    Assessment/Plan: MARGUITA VENNING is a 19 y.o. G1P1001 s/p Vaginal Delivery at [redacted]w[redacted]d for IOL for gHTN. BPs normotensive. Continue to monitor.   Delivery complicated by manual extraction of placenta, patient received Ancef x1. Has been afebrile and without systemic symptoms or increased abdominal pain.   Routine Postpartum Care: Doing well, pain well-controlled.  -- Continue routine care, lactation support  -- Contraception: undecided -- Feeding: breast  Dispo: Plan for discharge later today or early tomorrow.  Carollee Leitz MD [facility]   OB FELLOW POSTPARTUM PROGRESS NOTE ATTESTATION  I have seen and examined this patient and agree with above documentation in the resident's note.   Phill Myron, D.O. OB Fellow  08/12/2019, 11:42 AM

## 2019-08-13 MED ORDER — IBUPROFEN 600 MG PO TABS: 600.0000 mg | ORAL_TABLET | Freq: Four times a day (QID) | ORAL | 0 refills | Status: DC | PRN

## 2019-08-13 NOTE — Progress Notes (Signed)
CSW received consult due to score 11 on Edinburgh Depression Screen.    When CSW arrived to room to 37 MOB was resting in bed and infant was sleep in bassinet. CSW explained CSW role and invited MOB to ask questions during the assessment. MOB was polite, easy to engage, and was receptive to meeting with CSW.   CSW provided education regarding Baby Blues vs PMADs and provided MOB with resources for mental health follow up.  CSW encouraged MOB to evaluate her mental health throughout the postpartum period with the use of the New Mom Checklist developed by Postpartum Progress as well as the [place] Postnatal Depression Scale and notify a medical professional if symptoms arise.  MOB denied a MH hx and reported having a good support team that consists of MOB's and FOB's immediate family members.  MOB presented with insight and awareness and did not demonstrate any acute MH symptoms. MOB denied SI, HI, and DV when assessed by CSW.   MOB reported having all essential items to care for infant and reported feeling prepared to parent.   There are no barriers to discharge.  Laurey Arrow, MSW, LCSW Clinical Social Work [phone]

## 2019-08-14 LAB — SURGICAL PATHOLOGY

## 2019-08-18 ENCOUNTER — Telehealth: Payer: Self-pay | Admitting: Obstetrics & Gynecology

## 2019-08-18 NOTE — Telephone Encounter (Signed)
Called patient regarding appointment scheduled in our office and advised to come alone to the visits, however, a support person, over age 20, may accompany her  to appointment if assistance is needed for safety or care concerns. Otherwise, support persons should remain outside until the visit is complete.   We ask if you have had any exposure to anyone suspected or confirmed of having COVID-19 or if you are experiencing any of the following, to call and reschedule your appointment: fever, cough, shortness of breath, muscle pain, diarrhea, rash, vomiting, abdominal pain, red eye, weakness, bruising, bleeding, joint pain, or a severe headache. Spoke with the patient and walked her through signing up for mychart.  She understands that her visit is virtual.  Please know we will ask you these questions or similar questions when you arrive for your appointment and again its how we are keeping everyone safe.    Also,to keep you safe, please use the provided hand sanitizer when you enter the office. We are asking everyone in the office to wear a mask to help prevent the spread of germs. If you have a mask of your own, please wear it to your appointment, if not, we are happy to provide one for you.  Thank you for understanding and your cooperation.    [facility] Staff

## 2019-08-19 ENCOUNTER — Telehealth: Payer: 59

## 2019-08-24 ENCOUNTER — Emergency Department (HOSPITAL_COMMUNITY)
Admission: EM | Admit: 2019-08-24 | Discharge: 2019-08-24 | Disposition: A | Discharge status: Home / Self Care | Payer: 59 | Attending: Emergency Medicine | Admitting: Emergency Medicine

## 2019-08-24 ENCOUNTER — Encounter (HOSPITAL_COMMUNITY): Payer: Self-pay

## 2019-08-24 ENCOUNTER — Other Ambulatory Visit: Payer: Self-pay

## 2019-08-24 DIAGNOSIS — Z5321 Procedure and treatment not carried out due to patient leaving prior to being seen by health care provider: Secondary | ICD-10-CM | POA: Diagnosis not present

## 2019-08-24 DIAGNOSIS — R109 Unspecified abdominal pain: Secondary | ICD-10-CM | POA: Diagnosis not present

## 2019-08-24 MED ORDER — SODIUM CHLORIDE 0.9% FLUSH: 3.0000 mL | Freq: Once | INTRAVENOUS | Status: DC

## 2019-08-24 NOTE — ED Triage Notes (Addendum)
Pt presents to ED with complaints of mid abdominal stabbing 30 minutes after she ate some orange chicken along with nausea. Pt states pain is better now.

## 2019-08-24 NOTE — ED Notes (Signed)
Not in [place] for labs or for rooming

## 2019-08-25 ENCOUNTER — Telehealth: Payer: Self-pay | Admitting: *Deleted

## 2019-08-25 NOTE — Telephone Encounter (Signed)
I called patient back and offered appt with Dr. Glo Herring tomorrow morning. Patient stated that she can't come until Thursday. She says the pain in low in her pelvic area and has gotten worse, she wants to be seen just to make sure she is ok. She said that they had trouble with her placenta.

## 2019-08-25 NOTE — Telephone Encounter (Signed)
Patients mother called and stated that yesterday after patient ate she started having pain under her breast. She was in excruciating pain and crying. She went to [facility] and didn't stay because she waited 4 hours. The pain eased off. Today she is having a lot of pelvic pain. Would like to see if we can see her today.

## 2019-08-27 ENCOUNTER — Encounter: Payer: Self-pay | Admitting: Women's Health

## 2019-08-27 ENCOUNTER — Ambulatory Visit (INDEPENDENT_AMBULATORY_CARE_PROVIDER_SITE_OTHER): Payer: 59 | Admitting: Women's Health

## 2019-08-27 ENCOUNTER — Telehealth: Payer: Self-pay | Admitting: *Deleted

## 2019-08-27 ENCOUNTER — Other Ambulatory Visit: Payer: Self-pay

## 2019-08-27 VITALS — BP 115/77 | HR 72 | Ht 63.0 in | Wt 236.6 lb

## 2019-08-27 DIAGNOSIS — R1013 Epigastric pain: Secondary | ICD-10-CM | POA: Diagnosis not present

## 2019-08-27 DIAGNOSIS — R110 Nausea: Secondary | ICD-10-CM | POA: Diagnosis not present

## 2019-08-27 DIAGNOSIS — R1011 Right upper quadrant pain: Secondary | ICD-10-CM | POA: Diagnosis not present

## 2019-08-27 DIAGNOSIS — Z8759 Personal history of other complications of pregnancy, childbirth and the puerperium: Secondary | ICD-10-CM | POA: Diagnosis not present

## 2019-08-27 NOTE — Telephone Encounter (Signed)
-----   Message from Roma Schanz, [place] sent at 08/27/2019  4:08 PM EST ----- Regarding: gb u/s RUQ u/s ordered, will you call [facility] [phone]) to schedule please and let pt know. Thanks!

## 2019-08-27 NOTE — Patient Instructions (Signed)
Gallbladder Eating Plan °If you have a gallbladder condition, you may have trouble digesting fats. Eating a low-fat diet can help reduce your symptoms, and may be helpful before and after having surgery to remove your gallbladder (cholecystectomy). Your health care provider may recommend that you work with a diet and nutrition specialist (dietitian) to help you reduce the amount of fat in your diet. °What are tips for following this plan? °General guidelines °· Limit your fat intake to less than 30% of your total daily calories. If you eat around 1,800 calories each day, this is less than 60 grams (g) of fat per day. °· Fat is an important part of a healthy diet. Eating a low-fat diet can make it hard to maintain a healthy body weight. Ask your dietitian how much fat, calories, and other nutrients you need each day. °· Eat small, frequent meals throughout the day instead of three large meals. °· Drink at least 8-10 cups of fluid a day. Drink enough fluid to keep your urine clear or pale yellow. °· Limit alcohol intake to no more than 1 drink a day for nonpregnant women and 2 drinks a day for men. One drink equals 12 oz of beer, 5 oz of wine, or 1½ oz of hard liquor. °Reading food labels °· Check Nutrition Facts on food labels for the amount of fat per serving. Choose foods with less than 3 grams of fat per serving. °Shopping °· Choose nonfat and low-fat healthy foods. Look for the words “nonfat,” “low fat,” or “fat free.” °· Avoid buying processed or prepackaged foods. °Cooking °· Cook using low-fat methods, such as baking, broiling, grilling, or boiling. °· Cook with small amounts of healthy fats, such as olive oil, grapeseed oil, canola oil, or sunflower oil. °What foods are recommended? ° °· All fresh, frozen, or canned fruits and vegetables. °· Whole grains. °· Low-fat or non-fat (skim) milk and yogurt. °· Lean meat, skinless poultry, fish, eggs, and beans. °· Low-fat protein supplement powders or  drinks. °· Spices and herbs. °What foods are not recommended? °· High-fat foods. These include baked goods, fast food, fatty cuts of meat, ice cream, french toast, sweet rolls, pizza, cheese bread, foods covered with butter, creamy sauces, or cheese. °· Fried foods. These include french fries, tempura, battered fish, breaded chicken, fried breads, and sweets. °· Foods with strong odors. °· Foods that cause bloating and gas. °Summary °· A low-fat diet can be helpful if you have a gallbladder condition, or before and after gallbladder surgery. °· Limit your fat intake to less than 30% of your total daily calories. This is about 60 g of fat if you eat 1,800 calories each day. °· Eat small, frequent meals throughout the day instead of three large meals. °This information is not intended to replace advice given to you by your health care provider. Make sure you discuss any questions you have with your health care provider. °Document Released: 09/08/2013 Document Revised: 12/25/2018 Document Reviewed: 10/11/2016 °Elsevier Patient Education © 2020 Elsevier Inc. ° °

## 2019-08-27 NOTE — Progress Notes (Signed)
   GYN VISIT Patient name: Helen Sims MRN [id number]  Date of birth: Apr 03, 2001 Chief Complaint:   Pelvic Pain (worsening)  History of Present Illness:   Helen Sims is a 18 y.o. G50P1001 Caucasian female 2wks s/p SVB, being seen today for abdominal pain.  Ate chinese, then app 1hr later began to have epigastric pain she thought was heartburn, continued to worsen, and move to under Rt breast, +nausea, no vomiting, was severe enough she went to [facility], had to wait in waiting room for a couple of hours, pain improved while she was waiting, so she left. Mom states she had same epigastric/ruq pain after having her and it was her gallbladder. Pt has cut out fatty/greasy/spicy foods, and hasn't had another episode.  Had some lower abdominal pain few days later, which has since stopped. Had manual extraction of placenta, was given Ancef. Denies fever/chills. Reports normal lochia. Had Rt sulcal and Lt labial tear w/ repair. Wiped the other day and saw a string.   No LMP recorded. Review of Systems:   Pertinent items are noted in HPI Denies fever/chills, dizziness, headaches, visual disturbances, fatigue, shortness of breath, chest pain, abdominal pain, vomiting, abnormal vaginal discharge/itching/odor/irritation, problems with periods, bowel movements, urination, or intercourse unless otherwise stated above.  Pertinent History Reviewed:  Reviewed past medical,surgical, social, obstetrical and family history.  Reviewed problem list, medications and allergies. Physical Assessment:   Vitals:   08/27/19 1530  BP: 115/77  Pulse: 72  Weight: 236 lb 9.6 oz (107.3 kg)  Height: 5\' 3"  (1.6 m)  Body mass index is 41.91 kg/m.       Physical Examination:   General appearance: alert, well appearing, and in no distress  Mental status: alert, oriented to person, place, and time  Skin: warm & dry   Cardiovascular: normal heart rate noted  Respiratory: normal respiratory effort, no distress  Abdomen:  soft, non-tender, no RUQ/epigastric or lower pelvic tenderness  Pelvic: vulva- lac healing well, well-approximated. Bimanual- no CMT, uterine or adnexal tenderness, lochia minimal  Extremities: no edema   Chaperone: Peggy Dones    No results found for this or any previous visit (from the past 24 hour(s)).  Assessment & Plan:  1) RUQ/epigastric pain w/ nausea> x1 after eating [place], will get RUQ u/s to r/o GB disease. Order placed, note routed to Tish to schedule and notify pt.   Meds: No orders of the defined types were placed in this encounter.   Orders Placed This Encounter  Procedures  . US ABDOMEN LIMITED RUQ    Return for As scheduled for postpartum visit.  [place], [facility] 08/27/2019 4:16 PM

## 2019-08-27 NOTE — Telephone Encounter (Signed)
Patient scheduled for ultrasound at [facility] on 12/15 @ 9:30.  To arrive at 9:15.  Nothing to eat or drink after midnight. LMOVM

## 2019-09-01 ENCOUNTER — Ambulatory Visit (HOSPITAL_COMMUNITY)
Admission: RE | Admit: 2019-09-01 | Discharge: 2019-09-01 | Disposition: A | Discharge status: Home / Self Care | Payer: 59 | Source: Ambulatory Visit | Attending: Women's Health | Admitting: Women's Health

## 2019-09-01 ENCOUNTER — Telehealth: Payer: Self-pay | Admitting: Women's Health

## 2019-09-01 ENCOUNTER — Other Ambulatory Visit: Payer: Self-pay

## 2019-09-01 DIAGNOSIS — R110 Nausea: Secondary | ICD-10-CM | POA: Diagnosis present

## 2019-09-01 DIAGNOSIS — R1011 Right upper quadrant pain: Secondary | ICD-10-CM

## 2019-09-01 DIAGNOSIS — K8000 Calculus of gallbladder with acute cholecystitis without obstruction: Secondary | ICD-10-CM

## 2019-09-01 DIAGNOSIS — R1013 Epigastric pain: Secondary | ICD-10-CM | POA: Diagnosis present

## 2019-09-01 MED ORDER — PROMETHAZINE HCL 25 MG PO TABS: 12.5000 mg | ORAL_TABLET | Freq: Four times a day (QID) | ORAL | 0 refills | Status: DC | PRN

## 2019-09-01 NOTE — Telephone Encounter (Signed)
Notified pt of gb u/s results. Pt currently afebrile, no vomiting or pain, some nausea. Discussed w/ [facility], doesn't need inpatient management. Referral to [facility] ordered, note routed to Tish to f/u. Rx phenergan. Discussed warning s/s, reasons to seek care.  Roma Schanz, CNM, [facility] 09/01/2019 10:45 AM

## 2019-09-07 ENCOUNTER — Encounter: Payer: Self-pay | Admitting: Women's Health

## 2019-09-07 DIAGNOSIS — K8020 Calculus of gallbladder without cholecystitis without obstruction: Secondary | ICD-10-CM | POA: Insufficient documentation

## 2019-09-08 ENCOUNTER — Encounter: Payer: Self-pay | Admitting: General Surgery

## 2019-09-08 ENCOUNTER — Ambulatory Visit (INDEPENDENT_AMBULATORY_CARE_PROVIDER_SITE_OTHER): Payer: 59 | Admitting: General Surgery

## 2019-09-08 ENCOUNTER — Other Ambulatory Visit: Payer: Self-pay

## 2019-09-08 VITALS — BP 114/77 | HR 63 | Temp 98.1°F | Resp 16 | Ht 63.0 in | Wt 237.0 lb

## 2019-09-08 DIAGNOSIS — K8020 Calculus of gallbladder without cholecystitis without obstruction: Secondary | ICD-10-CM

## 2019-09-08 NOTE — Progress Notes (Signed)
[name] History and Physical  Reason for Referral: Gallstones  Referring Physician:  Shawna Clamp, CNM ([facility])   Chief Complaint    Abdominal Pain      Helen Sims is a 18 y.o. female.  HPI: Helen Sims is a very sweet 18 you with a recent spontaneous vaginal delivery about 1 month ago who was seen by the ED and her Ob Gyn for epigastric pain and heartburn.  She says the pain has occurred with greasy and spicy foods, and that she has cut back on some of these foods with improvement in her symptoms.  She has some nausea but no vomiting, and had pain severe enough to go to the ED 1 episode.  She reports that she thinks she had some issues with her gallbladder during her pregnancy but did not realize.      She has undergone [place] to evaluate her further and was found to have stones.   Past Medical History:  Diagnosis Date  . ADHD (attention deficit hyperactivity disorder)   . Insomnia     Past Surgical History:  Procedure Laterality Date  . tubes in ears      Family History  Problem Relation Age of Onset  . Hypertension Father   . Hypertension Mother     Social History   Tobacco Use  . Smoking status: Never Smoker  . Smokeless tobacco: Never Used  Substance Use Topics  . Alcohol use: No  . Drug use: No    Medications: I have reviewed the patient's current medications. Allergies as of 09/08/2019   No Known Allergies     Medication List       Accurate as of September 08, 2019 11:41 AM. If you have any questions, ask your nurse or doctor.        ibuprofen 600 MG tablet Commonly known as: ADVIL Take 1 tablet (600 mg total) by mouth every 6 (six) hours as needed for moderate pain.   Prenatal Vitamin 27-0.8 MG Tabs Take 1 tablet by mouth daily.   promethazine 25 MG tablet Commonly known as: PHENERGAN Take 0.5-1 tablets (12.5-25 mg total) by mouth every 6 (six) hours as needed for nausea or vomiting.        ROS:  A  comprehensive review of systems was negative except for: Gastrointestinal: positive for abdominal pain, nausea and reflux symptoms Musculoskeletal: positive for back pain  Blood pressure 114/77, pulse 63, temperature 98.1 F (36.7 C), temperature source Oral, resp. rate 16, height 5\' 3"  (1.6 m), weight 237 lb (107.5 kg), SpO2 98 %, not currently breastfeeding. Physical Exam Vitals reviewed.  Constitutional:      Appearance: She is well-developed.  HENT:     Head: Normocephalic and atraumatic.  Eyes:     Extraocular Movements: Extraocular movements intact.  Cardiovascular:     Rate and Rhythm: Normal rate and regular rhythm.  Pulmonary:     Effort: Pulmonary effort is normal.     Breath sounds: Normal breath sounds.  Abdominal:     General: There is no distension.     Palpations: Abdomen is soft.     Tenderness: There is no abdominal tenderness. There is no guarding or rebound.  Skin:    General: Skin is warm and dry.  Neurological:     General: No focal deficit present.     Mental Status: She is alert and oriented to person, place, and time.  Psychiatric:  Mood and Affect: Mood normal.        Behavior: Behavior normal.     Results: US RUQ 09/01/19 IMPRESSION: Cholelithiasis. Gallbladder wall is mildly thickened. There may be a degree of acute cholecystitis in this circumstance. This finding may warrant nuclear medicine hepatobiliary imaging study to assess for cystic duct patency.  Study otherwise unremarkable.  CBD 3mm   Assessment & Plan:  Helen Sims is a 18 y.o. female with symptomatic cholelithiasis who has been having issues since the birth of her baby. She wants to proceed with having her gallbladder removed. She does not breastfeed.   -PLAN: I counseled the patient about the indication, risks and benefits of laparoscopic cholecystectomy.  She understands there is a very small chance for bleeding, infection, injury to normal structures (including  common bile duct), conversion to open surgery, persistent symptoms, evolution of postcholecystectomy diarrhea, need for secondary interventions, anesthesia reaction, cardiopulmonary issues and other risks not specifically detailed here. I described the expected recovery, the plan for follow-up and the restrictions during the recovery phase.  All questions were answered.  All questions were answered to the satisfaction of the patient.   Amalia Edgecombe C Loron Weimer 09/08/2019, 11:41 AM     

## 2019-09-08 NOTE — Patient Instructions (Signed)
Cholelithiasis  Cholelithiasis is also called "gallstones." It is a kind of gallbladder disease. The gallbladder is an organ that stores a liquid (bile) that helps you digest fat. Gallstones may not cause symptoms (may be silent gallstones) until they cause a blockage, and then they can cause pain (gallbladder attack). Follow these instructions at home:  Take over-the-counter and prescription medicines only as told by your doctor.  Stay at a healthy weight.  Eat healthy foods. This includes: ? Eating fewer fatty foods, like fried foods. ? Eating fewer refined carbs (refined carbohydrates). Refined carbs are breads and grains that are highly processed, like white bread and white rice. Instead, choose whole grains like whole-wheat bread and brown rice. ? Eating more fiber. Almonds, fresh fruit, and beans are healthy sources of fiber.  Keep all follow-up visits as told by your doctor. This is important. Contact a doctor if:  You have sudden pain in the upper right side of your belly (abdomen). Pain might spread to your right shoulder or your chest. This may be a sign of a gallbladder attack.  You feel sick to your stomach (are nauseous).  You throw up (vomit).  You have been diagnosed with gallstones that have no symptoms and you get: ? Belly pain. ? Discomfort, burning, or fullness in the upper part of your belly (indigestion). Get help right away if:  You have sudden pain in the upper right side of your belly, and it lasts for more than 2 hours.  You have belly pain that lasts for more than 5 hours.  You have a fever or chills.  You keep feeling sick to your stomach or you keep throwing up.  Your skin or the whites of your eyes turn yellow (jaundice).  You have dark-colored pee (urine).  You have light-colored poop (stool). Summary  Cholelithiasis is also called "gallstones."  The gallbladder is an organ that stores a liquid (bile) that helps you digest fat.  Silent  gallstones are gallstones that do not cause symptoms.  A gallbladder attack may cause sudden pain in the upper right side of your belly. Pain might spread to your right shoulder or your chest. If this happens, contact your doctor.  If you have sudden pain in the upper right side of your belly that lasts for more than 2 hours, get help right away. This information is not intended to replace advice given to you by your health care provider. Make sure you discuss any questions you have with your health care provider. Document Released: 02/20/2008 Document Revised: 08/16/2017 Document Reviewed: 05/20/2016 Elsevier Patient Education  2020 Elsevier Inc[employer]   Laparoscopic Cholecystectomy Laparoscopic cholecystectomy is surgery to remove the gallbladder. The gallbladder is a pear-shaped organ that lies beneath the liver on the right side of the body. The gallbladder stores bile, which is a fluid that helps the body to digest fats. Cholecystectomy is often done for inflammation of the gallbladder (cholecystitis). This condition is usually caused by a buildup of gallstones (cholelithiasis) in the gallbladder. Gallstones can block the flow of bile, which can result in inflammation and pain. In severe cases, emergency surgery may be required. This procedure is done though small incisions in your abdomen (laparoscopic surgery). A thin scope with a camera (laparoscope) is inserted through one incision. Thin surgical instruments are inserted through the other incisions. In some cases, a laparoscopic procedure may be turned into a type of surgery that is done through a larger incision (open surgery). Tell a health care provider about:    Any allergies you have.  All medicines you are taking, including vitamins, herbs, eye drops, creams, and over-the-counter medicines.  Any problems you or family members have had with anesthetic medicines.  Any blood disorders you have.  Any surgeries you have had.  Any medical  conditions you have.  Whether you are pregnant or may be pregnant. What are the risks? Generally, this is a safe procedure. However, problems may occur, including:  Infection.  Bleeding.  Allergic reactions to medicines.  Damage to other structures or organs.  A stone remaining in the common bile duct. The common bile duct carries bile from the gallbladder into the small intestine.  A bile leak from the cyst duct that is clipped when your gallbladder is removed. Medicines  Ask your health care provider about: ? Changing or stopping your regular medicines. This is especially important if you are taking diabetes medicines or blood thinners. ? Taking medicines such as aspirin and ibuprofen. These medicines can thin your blood. Do not take these medicines before your procedure if your health care provider instructs you not to.  You may be given antibiotic medicine to help prevent infection. General instructions  Let your health care provider know if you develop a cold or an infection before surgery.  Plan to have someone take you home from the hospital or clinic.  Ask your health care provider how your surgical site will be marked or identified. What happens during the procedure?   To reduce your risk of infection: ? Your health care team will wash or sanitize their hands. ? Your skin will be washed with soap. ? Hair may be removed from the surgical area.  An IV tube may be inserted into one of your veins.  You will be given one or more of the following: ? A medicine to help you relax (sedative). ? A medicine to make you fall asleep (general anesthetic).  A breathing tube will be placed in your mouth.  Your surgeon will make several small cuts (incisions) in your abdomen.  The laparoscope will be inserted through one of the small incisions. The camera on the laparoscope will send images to a TV screen (monitor) in the operating room. This lets your surgeon see inside your  abdomen.  Air-like gas will be pumped into your abdomen. This will expand your abdomen to give the surgeon more room to perform the surgery.  Other tools that are needed for the procedure will be inserted through the other incisions. The gallbladder will be removed through one of the incisions.  Your common bile duct may be examined. If stones are found in the common bile duct, they may be removed.  After your gallbladder has been removed, the incisions will be closed with stitches (sutures), staples, or skin glue.  Your incisions may be covered with a bandage (dressing). The procedure may vary among health care providers and hospitals. What happens after the procedure?  Your blood pressure, heart rate, breathing rate, and blood oxygen level will be monitored until the medicines you were given have worn off.  You will be given medicines as needed to control your pain.  Do not drive for 24 hours if you were given a sedative. This information is not intended to replace advice given to you by your health care provider. Make sure you discuss any questions you have with your health care provider. Document Released: 09/03/2005 Document Revised: 08/16/2017 Document Reviewed: 02/20/2016 Elsevier Patient Education  2020 Elsevier Inc.   

## 2019-09-09 ENCOUNTER — Telehealth: Payer: 59 | Admitting: Women's Health

## 2019-09-13 NOTE — H&P (Signed)
[name] History and Physical  Reason for Referral: Gallstones  Referring Physician:  Shawna Clamp, CNM ([facility])   Chief Complaint    Abdominal Pain      Helen Sims is a 18 y.o. female.  HPI: Helen Sims is a very sweet 18 you with a recent spontaneous vaginal delivery about 1 month ago who was seen by the ED and her Ob Gyn for epigastric pain and heartburn.  She says the pain has occurred with greasy and spicy foods, and that she has cut back on some of these foods with improvement in her symptoms.  She has some nausea but no vomiting, and had pain severe enough to go to the ED 1 episode.  She reports that she thinks she had some issues with her gallbladder during her pregnancy but did not realize.      She has undergone [place] to evaluate her further and was found to have stones.   Past Medical History:  Diagnosis Date  . ADHD (attention deficit hyperactivity disorder)   . Insomnia     Past Surgical History:  Procedure Laterality Date  . tubes in ears      Family History  Problem Relation Age of Onset  . Hypertension Father   . Hypertension Mother     Social History   Tobacco Use  . Smoking status: Never Smoker  . Smokeless tobacco: Never Used  Substance Use Topics  . Alcohol use: No  . Drug use: No    Medications: I have reviewed the patient's current medications. Allergies as of 09/08/2019   No Known Allergies     Medication List       Accurate as of September 08, 2019 11:41 AM. If you have any questions, ask your nurse or doctor.        ibuprofen 600 MG tablet Commonly known as: ADVIL Take 1 tablet (600 mg total) by mouth every 6 (six) hours as needed for moderate pain.   Prenatal Vitamin 27-0.8 MG Tabs Take 1 tablet by mouth daily.   promethazine 25 MG tablet Commonly known as: PHENERGAN Take 0.5-1 tablets (12.5-25 mg total) by mouth every 6 (six) hours as needed for nausea or vomiting.        ROS:  A  comprehensive review of systems was negative except for: Gastrointestinal: positive for abdominal pain, nausea and reflux symptoms Musculoskeletal: positive for back pain  Blood pressure 114/77, pulse 63, temperature 98.1 F (36.7 C), temperature source Oral, resp. rate 16, height 5\' 3"  (1.6 m), weight 237 lb (107.5 kg), SpO2 98 %, not currently breastfeeding. Physical Exam Vitals reviewed.  Constitutional:      Appearance: She is well-developed.  HENT:     Head: Normocephalic and atraumatic.  Eyes:     Extraocular Movements: Extraocular movements intact.  Cardiovascular:     Rate and Rhythm: Normal rate and regular rhythm.  Pulmonary:     Effort: Pulmonary effort is normal.     Breath sounds: Normal breath sounds.  Abdominal:     General: There is no distension.     Palpations: Abdomen is soft.     Tenderness: There is no abdominal tenderness. There is no guarding or rebound.  Skin:    General: Skin is warm and dry.  Neurological:     General: No focal deficit present.     Mental Status: She is alert and oriented to person, place, and time.  Psychiatric:  Mood and Affect: Mood normal.        Behavior: Behavior normal.     Results: [place] RUQ 09/01/19 IMPRESSION: Cholelithiasis. Gallbladder wall is mildly thickened. There may be a degree of acute cholecystitis in this circumstance. This finding may warrant nuclear medicine hepatobiliary imaging study to assess for cystic duct patency.  Study otherwise unremarkable.  CBD 54mm   Assessment & Plan:  Helen Sims is a 18 y.o. female with symptomatic cholelithiasis who has been having issues since the birth of her baby. She wants to proceed with having her gallbladder removed. She does not breastfeed.   -PLAN: I counseled the patient about the indication, risks and benefits of laparoscopic cholecystectomy.  She understands there is a very small chance for bleeding, infection, injury to normal structures (including  common bile duct), conversion to open surgery, persistent symptoms, evolution of postcholecystectomy diarrhea, need for secondary interventions, anesthesia reaction, cardiopulmonary issues and other risks not specifically detailed here. I described the expected recovery, the plan for follow-up and the restrictions during the recovery phase.  All questions were answered.  All questions were answered to the satisfaction of the patient.   Helen Sims 09/08/2019, 11:41 AM

## 2019-09-14 NOTE — Patient Instructions (Signed)
Your procedure is scheduled on: 09/21/2019               Report to [facility] at  6:15   AM.   Call this number if you have problems the morning of surgery: [phone]   Remember:   Do not Eat or Drink after midnight   :  Take these medicines the morning of surgery with A SIP OF WATER: none   Do not wear jewelry, make-up or nail polish.  Do not wear lotions, powders, or perfumes. You may wear deodorant.  Do not shave 48 hours prior to surgery. Men may shave face and neck.  Do not bring valuables to the hospital.  Contacts, dentures or bridgework may not be worn into surgery.  Leave suitcase in the car. After surgery it may be brought to your room.  For patients admitted to the hospital, checkout time is 11:00 AM the day of discharge.   Patients discharged the day of surgery will not be allowed to drive home.    Special Instructions: Shower using CHG night before surgery and shower the day of surgery use CHG.  Use special wash - you have one bottle of CHG for all showers.  You should use approximately 1/2 of the bottle for each shower.  Laparoscopic Cholecystectomy, Care After This sheet gives you information about how to care for yourself after your procedure. Your health care provider may also give you more specific instructions. If you have problems or questions, contact your health care provider. What can I expect after the procedure? After the procedure, it is common to have:  Pain at your incision sites. You will be given medicines to control this pain.  Mild nausea or vomiting.  Bloating and possible shoulder pain from the air-like gas that was used during the procedure. Follow these instructions at home: Incision care   Follow instructions from your health care provider about how to take care of your incisions. Make sure you: ? Wash your hands with soap and water before you change your bandage (dressing). If soap and water are not available, use hand  sanitizer. ? Change your dressing as told by your health care provider. ? Leave stitches (sutures), skin glue, or adhesive strips in place. These skin closures may need to be in place for 2 weeks or longer. If adhesive strip edges start to loosen and curl up, you may trim the loose edges. Do not remove adhesive strips completely unless your health care provider tells you to do that.  Do not take baths, swim, or use a hot tub until your health care provider approves. Ask your health care provider if you can take showers. You may only be allowed to take sponge baths for bathing.  Check your incision area every day for signs of infection. Check for: ? More redness, swelling, or pain. ? More fluid or blood. ? Warmth. ? Pus or a bad smell. Activity  Do not drive or use heavy machinery while taking prescription pain medicine.  Do not lift anything that is heavier than 10 lb (4.5 kg) until your health care provider approves.  Do not play contact sports until your health care provider approves.  Do not drive for 24 hours if you were given a medicine to help you relax (sedative).  Rest as needed. Do not return to work or school until your health care provider approves. General instructions  Take over-the-counter and prescription medicines only as told by your health care provider.  To prevent or treat constipation while you are taking prescription pain medicine, your health care provider may recommend that you: ? Drink enough fluid to keep your urine clear or pale yellow. ? Take over-the-counter or prescription medicines. ? Eat foods that are high in fiber, such as fresh fruits and vegetables, whole grains, and beans. ? Limit foods that are high in fat and processed sugars, such as fried and sweet foods. Contact a health care provider if:  You develop a rash.  You have more redness, swelling, or pain around your incisions.  You have more fluid or blood coming from your incisions.  Your  incisions feel warm to the touch.  You have pus or a bad smell coming from your incisions.  You have a fever.  One or more of your incisions breaks open. Get help right away if:  You have trouble breathing.  You have chest pain.  You have increasing pain in your shoulders.  You faint or feel dizzy when you stand.  You have severe pain in your abdomen.  You have nausea or vomiting that lasts for more than one day.  You have leg pain. This information is not intended to replace advice given to you by your health care provider. Make sure you discuss any questions you have with your health care provider. Document Released: 09/03/2005 Document Revised: 08/16/2017 Document Reviewed: 02/20/2016 Elsevier Patient Education  2020 [place] Anesthesia, Adult, Care After This sheet gives you information about how to care for yourself after your procedure. Your health care provider may also give you more specific instructions. If you have problems or questions, contact your health care provider. What can I expect after the procedure? After the procedure, the following side effects are common:  Pain or discomfort at the IV site.  Nausea.  Vomiting.  Sore throat.  Trouble concentrating.  Feeling cold or chills.  Weak or tired.  Sleepiness and fatigue.  Soreness and body aches. These side effects can affect parts of the body that were not involved in surgery. Follow these instructions at home:  For at least 24 hours after the procedure:  Have a responsible adult stay with you. It is important to have someone help care for you until you are awake and alert.  Rest as needed.  Do not: ? Participate in activities in which you could fall or become injured. ? Drive. ? Use heavy machinery. ? Drink alcohol. ? Take sleeping pills or medicines that cause drowsiness. ? Make important decisions or sign legal documents. ? Take care of children on your own. Eating and  drinking  Follow any instructions from your health care provider about eating or drinking restrictions.  When you feel hungry, start by eating small amounts of foods that are soft and easy to digest (bland), such as toast. Gradually return to your regular diet.  Drink enough fluid to keep your urine pale yellow.  If you vomit, rehydrate by drinking water, juice, or clear broth. General instructions  If you have sleep apnea, surgery and certain medicines can increase your risk for breathing problems. Follow instructions from your health care provider about wearing your sleep device: ? Anytime you are sleeping, including during daytime naps. ? While taking prescription pain medicines, sleeping medicines, or medicines that make you drowsy.  Return to your normal activities as told by your health care provider. Ask your health care provider what activities are safe for you.  Take over-the-counter and prescription medicines only as told  by your health care provider.  If you smoke, do not smoke without supervision.  Keep all follow-up visits as told by your health care provider. This is important. Contact a health care provider if:  You have nausea or vomiting that does not get better with medicine.  You cannot eat or drink without vomiting.  You have pain that does not get better with medicine.  You are unable to pass urine.  You develop a skin rash.  You have a fever.  You have redness around your IV site that gets worse. Get help right away if:  You have difficulty breathing.  You have chest pain.  You have blood in your urine or stool, or you vomit blood. Summary  After the procedure, it is common to have a sore throat or nausea. It is also common to feel tired.  Have a responsible adult stay with you for the first 24 hours after general anesthesia. It is important to have someone help care for you until you are awake and alert.  When you feel hungry, start by eating  small amounts of foods that are soft and easy to digest (bland), such as toast. Gradually return to your regular diet.  Drink enough fluid to keep your urine pale yellow.  Return to your normal activities as told by your health care provider. Ask your health care provider what activities are safe for you. This information is not intended to replace advice given to you by your health care provider. Make sure you discuss any questions you have with your health care provider. Document Released: 12/10/2000 Document Revised: 09/06/2017 Document Reviewed: 04/19/2017 Elsevier Patient Education  2020 [employer]

## 2019-09-16 ENCOUNTER — Encounter (HOSPITAL_COMMUNITY): Payer: Self-pay

## 2019-09-16 ENCOUNTER — Encounter (HOSPITAL_COMMUNITY)
Admission: RE | Admit: 2019-09-16 | Discharge: 2019-09-16 | Disposition: A | Discharge status: Home / Self Care | Payer: 59 | Source: Ambulatory Visit | Attending: General Surgery | Admitting: General Surgery

## 2019-09-16 ENCOUNTER — Other Ambulatory Visit: Payer: Self-pay

## 2019-09-16 NOTE — Progress Notes (Addendum)
Serum pregnancy test not indicated due to recent pregnancy with vaginal delivery on 08/11/19 per Dr. Dan Maker.

## 2019-09-19 ENCOUNTER — Other Ambulatory Visit (HOSPITAL_COMMUNITY)
Admission: RE | Admit: 2019-09-19 | Discharge: 2019-09-19 | Disposition: A | Discharge status: Home / Self Care | Payer: 59 | Source: Ambulatory Visit | Attending: General Surgery | Admitting: General Surgery

## 2019-09-19 DIAGNOSIS — Z01812 Encounter for preprocedural laboratory examination: Secondary | ICD-10-CM | POA: Insufficient documentation

## 2019-09-19 DIAGNOSIS — Z20822 Contact with and (suspected) exposure to covid-19: Secondary | ICD-10-CM | POA: Diagnosis not present

## 2019-09-19 LAB — SARS CORONAVIRUS 2 (TAT 6-24 HRS): SARS Coronavirus 2: NEGATIVE

## 2019-09-21 ENCOUNTER — Ambulatory Visit (HOSPITAL_COMMUNITY): Payer: 59 | Admitting: Anesthesiology

## 2019-09-21 ENCOUNTER — Ambulatory Visit (HOSPITAL_COMMUNITY)
Admission: RE | Admit: 2019-09-21 | Discharge: 2019-09-21 | Disposition: A | Discharge status: Home / Self Care | Payer: 59 | Attending: General Surgery | Admitting: General Surgery

## 2019-09-21 ENCOUNTER — Encounter (HOSPITAL_COMMUNITY)
Admission: RE | Disposition: A | Discharge status: Home / Self Care | Payer: Self-pay | Source: Home / Self Care | Attending: General Surgery

## 2019-09-21 DIAGNOSIS — O9963 Diseases of the digestive system complicating the puerperium: Secondary | ICD-10-CM | POA: Insufficient documentation

## 2019-09-21 DIAGNOSIS — K8010 Calculus of gallbladder with chronic cholecystitis without obstruction: Secondary | ICD-10-CM | POA: Diagnosis present

## 2019-09-21 DIAGNOSIS — O99215 Obesity complicating the puerperium: Secondary | ICD-10-CM | POA: Insufficient documentation

## 2019-09-21 DIAGNOSIS — G4700 Insomnia, unspecified: Secondary | ICD-10-CM | POA: Diagnosis not present

## 2019-09-21 DIAGNOSIS — O99345 Other mental disorders complicating the puerperium: Secondary | ICD-10-CM | POA: Insufficient documentation

## 2019-09-21 DIAGNOSIS — K8020 Calculus of gallbladder without cholecystitis without obstruction: Secondary | ICD-10-CM

## 2019-09-21 DIAGNOSIS — F909 Attention-deficit hyperactivity disorder, unspecified type: Secondary | ICD-10-CM | POA: Diagnosis not present

## 2019-09-21 HISTORY — PX: CHOLECYSTECTOMY: SHX55

## 2019-09-21 LAB — PREGNANCY, URINE: Preg Test, Ur: NEGATIVE

## 2019-09-21 SURGERY — LAPAROSCOPIC CHOLECYSTECTOMY
Anesthesia: General | Site: Abdomen

## 2019-09-21 MED ORDER — LACTATED RINGERS IV SOLN: INTRAVENOUS | Status: DC

## 2019-09-21 MED ORDER — PROPOFOL 10 MG/ML IV BOLUS
INTRAVENOUS | Status: DC | PRN
  Administered 2019-09-21: 200 mg via INTRAVENOUS

## 2019-09-21 MED ORDER — BUPIVACAINE HCL (PF) 0.5 % IJ SOLN
INTRAMUSCULAR | Status: AC
  Filled 2019-09-21: qty 30

## 2019-09-21 MED ORDER — ROCURONIUM 10MG/ML (10ML) SYRINGE FOR MEDFUSION PUMP - OPTIME
INTRAVENOUS | Status: DC | PRN
  Administered 2019-09-21: 30 mg via INTRAVENOUS

## 2019-09-21 MED ORDER — OXYCODONE HCL 5 MG PO TABS: 5.0000 mg | ORAL_TABLET | ORAL | 0 refills | Status: DC | PRN

## 2019-09-21 MED ORDER — MIDAZOLAM HCL 2 MG/2ML IJ SOLN: 0.5000 mg | Freq: Once | INTRAMUSCULAR | Status: DC | PRN

## 2019-09-21 MED ORDER — FENTANYL CITRATE (PF) 100 MCG/2ML IJ SOLN
INTRAMUSCULAR | Status: DC | PRN
  Administered 2019-09-21 (×4): 50 ug via INTRAVENOUS

## 2019-09-21 MED ORDER — ONDANSETRON HCL 4 MG/2ML IJ SOLN
INTRAMUSCULAR | Status: DC | PRN
  Administered 2019-09-21: 4 mg via INTRAVENOUS

## 2019-09-21 MED ORDER — HYDROMORPHONE HCL 1 MG/ML IJ SOLN
0.2500 mg | INTRAMUSCULAR | Status: DC | PRN
  Administered 2019-09-21 (×2): 0.25 mg via INTRAVENOUS
  Filled 2019-09-21: qty 0.5

## 2019-09-21 MED ORDER — SODIUM CHLORIDE 0.9 % IR SOLN
Status: DC | PRN
  Administered 2019-09-21: 1000 mL

## 2019-09-21 MED ORDER — CHLORHEXIDINE GLUCONATE CLOTH 2 % EX PADS: 6.0000 | MEDICATED_PAD | Freq: Once | CUTANEOUS | Status: DC

## 2019-09-21 MED ORDER — SODIUM CHLORIDE 0.9 % IV SOLN
2.0000 g | INTRAVENOUS | Status: AC
  Administered 2019-09-21: 2 g via INTRAVENOUS
  Filled 2019-09-21: qty 2

## 2019-09-21 MED ORDER — PROMETHAZINE HCL 25 MG/ML IJ SOLN: 6.2500 mg | INTRAMUSCULAR | Status: DC | PRN

## 2019-09-21 MED ORDER — ONDANSETRON HCL 4 MG PO TABS: 4.0000 mg | ORAL_TABLET | Freq: Every day | ORAL | 1 refills | Status: DC | PRN

## 2019-09-21 MED ORDER — SUGAMMADEX SODIUM 200 MG/2ML IV SOLN
INTRAVENOUS | Status: DC | PRN
  Administered 2019-09-21: 400 mg via INTRAVENOUS

## 2019-09-21 MED ORDER — SUCCINYLCHOLINE 20MG/ML (10ML) SYRINGE FOR MEDFUSION PUMP - OPTIME
INTRAMUSCULAR | Status: DC | PRN
  Administered 2019-09-21: 120 mg via INTRAVENOUS

## 2019-09-21 MED ORDER — HYDROCODONE-ACETAMINOPHEN 5-325 MG PO TABS: 1.0000 | ORAL_TABLET | ORAL | 0 refills | Status: DC | PRN

## 2019-09-21 MED ORDER — MIDAZOLAM HCL 5 MG/5ML IJ SOLN
INTRAMUSCULAR | Status: DC | PRN
  Administered 2019-09-21: 2 mg via INTRAVENOUS

## 2019-09-21 MED ORDER — BUPIVACAINE HCL (PF) 0.5 % IJ SOLN
INTRAMUSCULAR | Status: DC | PRN
  Administered 2019-09-21: 10 mL

## 2019-09-21 MED ORDER — DOCUSATE SODIUM 100 MG PO CAPS: 100.0000 mg | ORAL_CAPSULE | Freq: Two times a day (BID) | ORAL | 2 refills | Status: DC

## 2019-09-21 MED ORDER — HEMOSTATIC AGENTS (NO CHARGE) OPTIME
TOPICAL | Status: DC | PRN
  Administered 2019-09-21: 1 via TOPICAL

## 2019-09-21 MED ORDER — HYDROCODONE-ACETAMINOPHEN 7.5-325 MG PO TABS
1.0000 | ORAL_TABLET | Freq: Once | ORAL | Status: AC | PRN
  Administered 2019-09-21: 1 via ORAL
  Filled 2019-09-21: qty 1

## 2019-09-21 SURGICAL SUPPLY — 46 items
APPLIER CLIP ROT 10 11.4 M/L (STAPLE) ×3
BAG RETRIEVAL 10 (BASKET) ×1
BAG RETRIEVAL 10MM (BASKET) ×1
BLADE SURG 15 STRL LF DISP TIS (BLADE) ×1 IMPLANT
BLADE SURG 15 STRL SS (BLADE) ×2
CHLORAPREP W/TINT 26 (MISCELLANEOUS) ×3 IMPLANT
CLIP APPLIE ROT 10 11.4 M/L (STAPLE) ×1 IMPLANT
CLOTH BEACON ORANGE TIMEOUT ST (SAFETY) ×3 IMPLANT
COVER LIGHT HANDLE STERIS (MISCELLANEOUS) ×6 IMPLANT
COVER WAND RF STERILE (DRAPES) ×3 IMPLANT
DECANTER SPIKE VIAL GLASS SM (MISCELLANEOUS) ×3 IMPLANT
DERMABOND ADVANCED (GAUZE/BANDAGES/DRESSINGS) ×2
DERMABOND ADVANCED .7 DNX12 (GAUZE/BANDAGES/DRESSINGS) ×1 IMPLANT
ELECT REM PT RETURN 9FT ADLT (ELECTROSURGICAL) ×3
ELECTRODE REM PT RTRN 9FT ADLT (ELECTROSURGICAL) ×1 IMPLANT
GLOVE BIO SURGEON STRL SZ 6.5 (GLOVE) ×2 IMPLANT
GLOVE BIO SURGEON STRL SZ7 (GLOVE) ×3 IMPLANT
GLOVE BIO SURGEONS STRL SZ 6.5 (GLOVE) ×1
GLOVE BIOGEL PI IND STRL 6.5 (GLOVE) ×1 IMPLANT
GLOVE BIOGEL PI IND STRL 7.0 (GLOVE) ×3 IMPLANT
GLOVE BIOGEL PI INDICATOR 6.5 (GLOVE) ×2
GLOVE BIOGEL PI INDICATOR 7.0 (GLOVE) ×6
GLOVE SURG SS PI 7.5 STRL IVOR (GLOVE) ×3 IMPLANT
GOWN STRL REUS W/ TWL LRG LVL3 (GOWN DISPOSABLE) ×1 IMPLANT
GOWN STRL REUS W/TWL LRG LVL3 (GOWN DISPOSABLE) ×11 IMPLANT
HEMOSTAT SNOW SURGICEL 2X4 (HEMOSTASIS) ×3 IMPLANT
INST SET LAPROSCOPIC AP (KITS) ×3 IMPLANT
KIT TURNOVER KIT A (KITS) ×3 IMPLANT
MANIFOLD NEPTUNE II (INSTRUMENTS) ×3 IMPLANT
NEEDLE INSUFFLATION 14GA 120MM (NEEDLE) ×3 IMPLANT
NS IRRIG 1000ML POUR BTL (IV SOLUTION) ×3 IMPLANT
PACK LAP CHOLE LZT030E (CUSTOM PROCEDURE TRAY) ×3 IMPLANT
PAD ARMBOARD 7.5X6 YLW CONV (MISCELLANEOUS) ×3 IMPLANT
SET BASIN LINEN APH (SET/KITS/TRAYS/PACK) ×3 IMPLANT
SET TUBE SMOKE EVAC HIGH FLOW (TUBING) ×3 IMPLANT
SLEEVE ENDOPATH XCEL 5M (ENDOMECHANICALS) ×3 IMPLANT
SUT MNCRL AB 4-0 PS2 18 (SUTURE) ×6 IMPLANT
SUT VICRYL 0 UR6 27IN ABS (SUTURE) ×3 IMPLANT
SYS BAG RETRIEVAL 10MM (BASKET) ×1
SYSTEM BAG RETRIEVAL 10MM (BASKET) ×1 IMPLANT
TROCAR ENDO BLADELESS 11MM (ENDOMECHANICALS) ×3 IMPLANT
TROCAR XCEL NON-BLD 5MMX100MML (ENDOMECHANICALS) ×3 IMPLANT
TROCAR XCEL UNIV SLVE 11M 100M (ENDOMECHANICALS) ×3 IMPLANT
TUBE CONNECTING 12'X1/4 (SUCTIONS) ×1
TUBE CONNECTING 12X1/4 (SUCTIONS) ×2 IMPLANT
WARMER LAPAROSCOPE (MISCELLANEOUS) ×3 IMPLANT

## 2019-09-21 NOTE — Interval H&P Note (Signed)
History and Physical Interval Note:  09/21/2019 7:13 AM  Helen Sims  has presented today for surgery, with the diagnosis of Cholelithiasis.  The various methods of treatment have been discussed with the patient and family. After consideration of risks, benefits and other options for treatment, the patient has consented to  Procedure(s): LAPAROSCOPIC CHOLECYSTECTOMY (N/A) as a surgical intervention.  The patient's history has been reviewed, patient examined, no change in status, stable for surgery.  I have reviewed the patient's chart and labs.  Questions were answered to the patient's satisfaction.    No changes. No questions or concerns.  Lucretia Roers

## 2019-09-21 NOTE — Progress Notes (Signed)
[facility]  Spoke with [facility]. No Roxicodone. Ordered Norco 5-325mg  q4 PRN (#20).  Algis Greenhouse, MD [facility] [address] [place], [place] [address] [phone]/ [phone] (office)

## 2019-09-21 NOTE — Anesthesia Postprocedure Evaluation (Signed)
Anesthesia Post Note  Patient: Helen Sims  Procedure(s) Performed: LAPAROSCOPIC CHOLECYSTECTOMY (N/A Abdomen)  Patient location during evaluation: PACU Anesthesia Type: General Level of consciousness: awake and alert and oriented Pain management: pain level controlled Vital Signs Assessment: post-procedure vital signs reviewed and stable Respiratory status: spontaneous breathing Cardiovascular status: blood pressure returned to baseline and stable Postop Assessment: no apparent nausea or vomiting Anesthetic complications: no     Last Vitals:  Vitals:   09/21/19 0915 09/21/19 0929  BP: (!) 130/96 128/87  Pulse: 87 82  Resp: 16 18  Temp:  36.9 C  SpO2: 97% 95%    Last Pain:  Vitals:   09/21/19 0929  TempSrc: Oral  PainSc: 5                  Zubair Lofton

## 2019-09-21 NOTE — Transfer of Care (Signed)
Immediate Anesthesia Transfer of Care Note  Patient: Helen Sims  Procedure(s) Performed: LAPAROSCOPIC CHOLECYSTECTOMY (N/A Abdomen)  Patient Location: PACU  Anesthesia Type:General  Level of Consciousness: awake  Airway & Oxygen Therapy: Patient Spontanous Breathing  Post-op Assessment: Report given to RN  Post vital signs: Reviewed  Last Vitals:  Vitals Value Taken Time  BP 148/116 09/21/19 0832  Temp    Pulse 100 09/21/19 0834  Resp 26 09/21/19 0834  SpO2 100 % 09/21/19 0834  Vitals shown include unvalidated device data.  Last Pain:  Vitals:   09/21/19 0645  PainSc: 0-No pain         Complications: No apparent anesthesia complications

## 2019-09-21 NOTE — Progress Notes (Signed)
[name]  Patient's boyfriend notified that she did well. Rx went to [facility].   Algis Greenhouse, MD [facility] [address] [place], [place] [address] [phone]/ [phone] (office)

## 2019-09-21 NOTE — Anesthesia Preprocedure Evaluation (Signed)
Anesthesia Evaluation  Patient identified by MRN, date of birth, ID band Patient awake    Reviewed: Allergy & Precautions, NPO status , Patient's Chart, lab work & pertinent test results  Airway Mallampati: II  TM Distance: >3 FB Neck ROM: Full    Dental no notable dental hx. (+) Teeth Intact   Pulmonary neg pulmonary ROS,    Pulmonary exam normal breath sounds clear to auscultation       Cardiovascular Exercise Tolerance: Good negative cardio ROS Normal cardiovascular examI Rhythm:Regular Rate:Normal     Neuro/Psych negative neurological ROS  negative psych ROS   GI/Hepatic negative GI ROS, Neg liver ROS,   Endo/Other  Morbid obesity  Renal/GU negative Renal ROS  negative genitourinary   Musculoskeletal negative musculoskeletal ROS (+)   Abdominal   Peds negative pediatric ROS (+)  Hematology negative hematology ROS (+)   Anesthesia Other Findings 5 weeks post partum  Last two preg tests negative- WTP  Reproductive/Obstetrics negative OB ROS                             Anesthesia Physical Anesthesia Plan  ASA: III  Anesthesia Plan: General   Post-op Pain Management:    Induction: Intravenous  PONV Risk Score and Plan: 3 and Midazolam, Ondansetron, Dexamethasone and Treatment may vary due to age or medical condition  Airway Management Planned: Oral ETT  Additional Equipment:   Intra-op Plan:   Post-operative Plan: Extubation in OR  Informed Consent: I have reviewed the patients History and Physical, chart, labs and discussed the procedure including the risks, benefits and alternatives for the proposed anesthesia with the patient or authorized representative who has indicated his/her understanding and acceptance.     Dental advisory given  Plan Discussed with: CRNA  Anesthesia Plan Comments: (Plan Full PPE use Plan GETA D/W PT -WTP with same after Q&A  D/w pt if  pregnant - increased risk to developing fetus  Voiced understanding WTP - last two tests negative )        Anesthesia Quick Evaluation

## 2019-09-21 NOTE — Anesthesia Procedure Notes (Addendum)
Procedure Name: Intubation Date/Time: 09/21/2019 7:38 AM Performed by: Ollen Bowl, CRNA Pre-anesthesia Checklist: Patient identified, Patient being monitored, Timeout performed, Emergency Drugs available and Suction available Patient Re-evaluated:Patient Re-evaluated prior to induction Oxygen Delivery Method: Circle System Utilized Preoxygenation: Pre-oxygenation with 100% oxygen Induction Type: IV induction Ventilation: Mask ventilation without difficulty Laryngoscope Size: Mac and 3 Grade View: Grade I Tube type: Oral Tube size: 7.0 mm Number of attempts: 1 Airway Equipment and Method: stylet Placement Confirmation: ETT inserted through vocal cords under direct vision,  positive ETCO2 and breath sounds checked- equal and bilateral Secured at: 21 cm Tube secured with: Tape Dental Injury: Teeth and Oropharynx as per pre-operative assessment

## 2019-09-21 NOTE — Op Note (Signed)
Operative Note   Preoperative Diagnosis: Symptomatic cholelithiasis   Postoperative Diagnosis: Same   Procedure(s) Performed: Laparoscopic cholecystectomy   Surgeon: Lillia Abed C. Henreitta Leber, MD   Assistants: Franky Macho, MD    Anesthesia: General endotracheal   Anesthesiologist: Dorena Cookey, MD    Specimens: Gallbladder    Estimated Blood Loss: Minimal    Blood Replacement: None    Complications: None    Operative Findings: Distended gallbladder   Procedure: The patient was taken to the operating room and placed supine. General endotracheal anesthesia was induced. Intravenous antibiotics were administered per protocol. An orogastric tube positioned to decompress the stomach. The abdomen was prepared and draped in the usual sterile fashion.    A supraumbilical incision was made and a Veress technique was utilized to achieve pneumoperitoneum to 15 mmHg with carbon dioxide. A 11 mm optiview port was placed through the supraumbilical region, and a 10 mm 0-degree operative laparoscope was introduced. The area underlying the trocar and Veress needle were inspected and without evidence of injury.  Remaining trocars were placed under direct vision. Two 5 mm ports were placed in the right abdomen, between the anterior axillary and midclavicular line.  A final 11 mm port was placed through the mid-epigastrium, near the falciform ligament.    The gallbladder fundus was elevated cephalad and the infundibulum was retracted to the patient's right. The gallbladder/cystic duct junction was skeletonized. The cystic artery noted in the triangle of Calot and was also skeletonized.  We then continued liberal medial and lateral dissection until the critical view of safety was achieved.    The cystic duct and cystic artery were doubly clipped and divided. The gallbladder was then dissected from the liver bed with electrocautery. The specimen was placed in an Endopouch and was retrieved through the  epigastric site.   Final inspection revealed acceptable hemostasis. Surgical Jamelle Haring was placed in the gallbladder bed.  Trocars were removed and pneumoperitoneum was released. The epigastric and umbilical port sites were smaller than my finger and overlying the rib.  Skin incisions were closed with 4-0 Monocryl subcuticular sutures and Dermabond. The patient was awakened from anesthesia and extubated without complication.    Algis Greenhouse, MD [facility] [address] [place], [place] [address] [phone] (office)

## 2019-09-21 NOTE — Discharge Instructions (Signed)
Discharge Laparoscopic Surgery Instructions:  Common Complaints: Right shoulder pain is common after laparoscopic surgery. This is secondary to the gas used in the surgery being trapped under the diaphragm.  Walk to help your body absorb the gas. This will improve in a few days. Pain at the port sites are common, especially the larger port sites. This will improve with time.  Some nausea is common and poor appetite. The main goal is to stay hydrated the first few days after surgery.   Diet/ Activity: Diet as tolerated. You may not have an appetite, but it is important to stay hydrated. Drink 64 ounces of water a day. Your appetite will return with time.  Shower per your regular routine daily.  Do not take hot showers. Take warm showers that are less than 10 minutes. Rest and listen to your body, but do not remain in bed all day.  Walk everyday for at least 15-20 minutes. Deep cough and move around every 1-2 hours in the first few days after surgery.  Do not lift > 10 lbs, perform excessive bending, pushing, pulling, squatting for 1-2 weeks after surgery.  Do not pick at the dermabond glue on your incision sites.  This glue film will remain in place for 1-2 weeks and will start to peel off.  Do not place lotions or balms on your incision unless instructed to specifically by Dr. Bridges.   Pain Expectations and Narcotics: -After surgery you will have pain associated with your incisions and this is normal. The pain is muscular and nerve pain, and will get better with time. -You are encouraged and expected to take non narcotic medications like tylenol and ibuprofen (when able) to treat pain as multiple modalities can aid with pain treatment. -Narcotics are only used when pain is severe or there is breakthrough pain. -You are not expected to have a pain score of 0 after surgery, as we cannot prevent pain. A pain score of 3-4 that allows you to be functional, move, walk, and tolerate some activity is  the goal. The pain will continue to improve over the days after surgery and is dependent on your surgery. -Due to [place] law, we are only able to give a certain amount of pain medication to treat post operative pain, and we only give additional narcotics on a patient by patient basis.  -For most laparoscopic surgery, studies have shown that the majority of patients only need 10-15 narcotic pills, and for open surgeries most patients only need 15-20.   -Having appropriate expectations of pain and knowledge of pain management with non narcotics is important as we do not want anyone to become addicted to narcotic pain medication.  -Using ice packs in the first 48 hours and heating pads after 48 hours, wearing an abdominal binder (when recommended), and using over the counter medications are all ways to help with pain management.   -Simple acts like meditation and mindfulness practices after surgery can also help with pain control and research has proven the benefit of these practices.  Medication: Take tylenol and ibuprofen as needed for pain control, alternating every 4-6 hours.  Example:  Tylenol 1000mg @ 6am, 12noon, 6pm, 12midnight (Do not exceed 4000mg of tylenol a day). Ibuprofen 800mg @ 9am, 3pm, 9pm, 3am (Do not exceed 3600mg of ibuprofen a day).  Take Roxicodone for breakthrough pain every 4 hours.  Take Colace for constipation related to narcotic pain medication. If you do not have a bowel movement in 2 days, take Miralax   over the counter.  Drink plenty of water to also prevent constipation.   Contact Information: If you have questions or concerns, please call our office, [phone], Monday- Thursday 8AM-5PM and Friday 8AM-12Noon.  If it is after hours or on the weekend, please call [facility], [phone], and ask to speak to the surgeon on call for Dr. Henreitta Leber at [facility].    Laparoscopic Cholecystectomy, Care After This sheet gives you information about how to care for  yourself after your procedure. Your health care provider may also give you more specific instructions. If you have problems or questions, contact your health care provider. What can I expect after the procedure? After the procedure, it is common to have:  Pain at your incision sites. You will be given medicines to control this pain.  Mild nausea or vomiting.  Bloating and possible shoulder pain from the air-like gas that was used during the procedure. Follow these instructions at home: Incision care   Follow instructions from your health care provider about how to take care of your incisions. Make sure you: ? Wash your hands with soap and water before you change your bandage (dressing). If soap and water are not available, use hand sanitizer. ? Change your dressing as told by your health care provider. ? Leave stitches (sutures), skin glue, or adhesive strips in place. These skin closures may need to be in place for 2 weeks or longer. If adhesive strip edges start to loosen and curl up, you may trim the loose edges. Do not remove adhesive strips completely unless your health care provider tells you to do that.  Do not take baths, swim, or use a hot tub until your health care provider approves.   You may shower.  Check your incision area every day for signs of infection. Check for: ? More redness, swelling, or pain. ? More fluid or blood. ? Warmth. ? Pus or a bad smell. Activity  Do not drive or use heavy machinery while taking prescription pain medicine.  Do not lift anything that is heavier than 10 lb (4.5 kg) until your health care provider approves.  Do not play contact sports until your health care provider approves.  Do not drive for 24 hours if you were given a medicine to help you relax (sedative).  Rest as needed. Do not return to work or school until your health care provider approves. General instructions  Take over-the-counter and prescription medicines only as told  by your health care provider.  To prevent or treat constipation while you are taking prescription pain medicine, your health care provider may recommend that you: ? Drink enough fluid to keep your urine clear or pale yellow. ? Take over-the-counter or prescription medicines. ? Eat foods that are high in fiber, such as fresh fruits and vegetables, whole grains, and beans. ? Limit foods that are high in fat and processed sugars, such as fried and sweet foods. Contact a health care provider if:  You develop a rash.  You have more redness, swelling, or pain around your incisions.  You have more fluid or blood coming from your incisions.  Your incisions feel warm to the touch.  You have pus or a bad smell coming from your incisions.  You have a fever.  One or more of your incisions breaks open. Get help right away if:  You have trouble breathing.  You have chest pain.  You have increasing pain in your shoulders.  You faint or feel dizzy when  you stand.  You have severe pain in your abdomen.  You have nausea or vomiting that lasts for more than one day.  You have leg pain. This information is not intended to replace advice given to you by your health care provider. Make sure you discuss any questions you have with your health care provider. Document Revised: 08/16/2017 Document Reviewed: 02/20/2016 Elsevier Patient Education  2020 [employer] Anesthesia, Adult, Care After This sheet gives you information about how to care for yourself after your procedure. Your health care provider may also give you more specific instructions. If you have problems or questions, contact your health care provider. What can I expect after the procedure? After the procedure, the following side effects are common:  Pain or discomfort at the IV site.  Nausea.  Vomiting.  Sore throat.  Trouble concentrating.  Feeling cold or chills.  Weak or tired.  Sleepiness and  fatigue.  Soreness and body aches. These side effects can affect parts of the body that were not involved in surgery. Follow these instructions at home:  For at least 24 hours after the procedure:  Have a responsible adult stay with you. It is important to have someone help care for you until you are awake and alert.  Rest as needed.  Do not: ? Participate in activities in which you could fall or become injured. ? Drive. ? Use heavy machinery. ? Drink alcohol. ? Take sleeping pills or medicines that cause drowsiness. ? Make important decisions or sign legal documents. ? Take care of children on your own. Eating and drinking  Follow any instructions from your health care provider about eating or drinking restrictions.  When you feel hungry, start by eating small amounts of foods that are soft and easy to digest (bland), such as toast. Gradually return to your regular diet.  Drink enough fluid to keep your urine pale yellow.  If you vomit, rehydrate by drinking water, juice, or clear broth. General instructions  If you have sleep apnea, surgery and certain medicines can increase your risk for breathing problems. Follow instructions from your health care provider about wearing your sleep device: ? Anytime you are sleeping, including during daytime naps. ? While taking prescription pain medicines, sleeping medicines, or medicines that make you drowsy.  Return to your normal activities as told by your health care provider. Ask your health care provider what activities are safe for you.  Take over-the-counter and prescription medicines only as told by your health care provider.  If you smoke, do not smoke without supervision.  Keep all follow-up visits as told by your health care provider. This is important. Contact a health care provider if:  You have nausea or vomiting that does not get better with medicine.  You cannot eat or drink without vomiting.  You have pain that  does not get better with medicine.  You are unable to pass urine.  You develop a skin rash.  You have a fever.  You have redness around your IV site that gets worse. Get help right away if:  You have difficulty breathing.  You have chest pain.  You have blood in your urine or stool, or you vomit blood. Summary  After the procedure, it is common to have a sore throat or nausea. It is also common to feel tired.  Have a responsible adult stay with you for the first 24 hours after general anesthesia. It is important to have someone help care  for you until you are awake and alert.  When you feel hungry, start by eating small amounts of foods that are soft and easy to digest (bland), such as toast. Gradually return to your regular diet.  Drink enough fluid to keep your urine pale yellow.  Return to your normal activities as told by your health care provider. Ask your health care provider what activities are safe for you. This information is not intended to replace advice given to you by your health care provider. Make sure you discuss any questions you have with your health care provider. Document Revised: 09/06/2017 Document Reviewed: 04/19/2017 Elsevier Patient Education  2020 [employer].    You had Norco 7.5/325 (7.5 hydrocodone with 325mg  of acetominophen at 9:17am today in recovery room

## 2019-09-22 LAB — SURGICAL PATHOLOGY

## 2019-10-06 ENCOUNTER — Other Ambulatory Visit: Payer: Self-pay

## 2019-10-06 ENCOUNTER — Telehealth (INDEPENDENT_AMBULATORY_CARE_PROVIDER_SITE_OTHER): Payer: Self-pay | Admitting: General Surgery

## 2019-10-06 DIAGNOSIS — K8020 Calculus of gallbladder without cholecystitis without obstruction: Secondary | ICD-10-CM

## 2019-10-06 NOTE — Progress Notes (Signed)
[name]  I am calling the patient for post operative evaluation due to the current COVID 19 pandemic.  The patient had a laparoscopic cholecystectomy on 09/20/2018. The patient reports that they are doing well. The are tolerating a diet, having good pain control, and having regular Bms.  Incisions are healing and dermabond is peeling. The patient has no concerns.   Will see the patient PRN.   Pathology: FINAL MICROSCOPIC DIAGNOSIS:   A. GALLBLADDER, CHOLECYSTECTOMY:  - Chronic cholecystitis with cholelithiasis  Algis Greenhouse, MD [facility] [address] [place], [place] [address] [phone] (office)

## 2019-10-15 ENCOUNTER — Ambulatory Visit (INDEPENDENT_AMBULATORY_CARE_PROVIDER_SITE_OTHER): Payer: 59 | Admitting: *Deleted

## 2019-10-15 ENCOUNTER — Other Ambulatory Visit: Payer: Self-pay

## 2019-10-15 ENCOUNTER — Encounter: Payer: Self-pay | Admitting: *Deleted

## 2019-10-15 VITALS — BP 123/77 | HR 87 | Ht 63.0 in | Wt 233.6 lb

## 2019-10-15 DIAGNOSIS — Z3202 Encounter for pregnancy test, result negative: Secondary | ICD-10-CM

## 2019-10-15 DIAGNOSIS — Z3200 Encounter for pregnancy test, result unknown: Secondary | ICD-10-CM

## 2019-10-15 LAB — POCT URINE PREGNANCY: Preg Test, Ur: NEGATIVE

## 2019-10-15 NOTE — Progress Notes (Signed)
   NURSE VISIT- PREGNANCY CONFIRMATION   SUBJECTIVE:  Helen Sims is a 19 y.o. G55P1001 female at Unknown by uncertain LMP of Patient's last menstrual period was 09/16/2019 (approximate). Here for pregnancy confirmation.  Home pregnancy test: positive x 9  She reports nausea.  She is not taking prenatal vitamins.    OBJECTIVE:  BP 123/77 (BP Location: Right Arm, Patient Position: Sitting, Cuff Size: Large)   Pulse 87   Ht 5\' 3"  (1.6 m)   Wt 233 lb 9.6 oz (106 kg)   LMP 09/16/2019 (Approximate)   Breastfeeding No   BMI 41.38 kg/m   Appears well, in no apparent distress OB History  Gravida Para Term Preterm AB Living  1 1 1     1   SAB TAB Ectopic Multiple Live Births        0 1    # Outcome Date GA Lbr Len/2nd Weight Sex Delivery Anes PTL Lv  1 Term 08/11/19 [redacted]w[redacted]d 07:42 / 00:51 7 lb 9.2 oz (3.435 kg) M Vag-Spont EPI  LIV    Results for orders placed or performed in visit on 10/15/19 (from the past 24 hour(s))  POCT urine pregnancy   Collection Time: 10/15/19  3:07 PM  Result Value Ref Range   Preg Test, Ur Negative Negative    ASSESSMENT: Negative pregnancy test    PLAN: Will have pt go to lab for quant. Will wait for those results.  Prenatal vitamins: will wait for quant results   Nausea medicines: not currently needed   OB packet given: No  10/17/19  10/15/2019 3:12 PM

## 2019-10-15 NOTE — Progress Notes (Signed)
Chart reviewed for nurse visit. Agree with plan of care.  Griffin, Jennifer A, NP 10/15/2019 4:13 PM  

## 2019-10-16 LAB — BETA HCG QUANT (REF LAB): hCG Quant: <1 m[IU]/mL

## 2020-08-17 ENCOUNTER — Other Ambulatory Visit: Payer: Self-pay

## 2020-08-17 ENCOUNTER — Ambulatory Visit
Admission: EM | Admit: 2020-08-17 | Discharge: 2020-08-17 | Disposition: A | Discharge status: Home / Self Care | Payer: Medicaid Other | Attending: Emergency Medicine | Admitting: Emergency Medicine

## 2020-08-17 ENCOUNTER — Encounter: Payer: Self-pay | Admitting: Emergency Medicine

## 2020-08-17 DIAGNOSIS — H938X1 Other specified disorders of right ear: Secondary | ICD-10-CM

## 2020-08-17 DIAGNOSIS — J029 Acute pharyngitis, unspecified: Secondary | ICD-10-CM

## 2020-08-17 LAB — POCT RAPID STREP A (OFFICE): Rapid Strep A Screen: NEGATIVE

## 2020-08-17 MED ORDER — PREDNISONE 20 MG PO TABS: 20.0000 mg | ORAL_TABLET | Freq: Two times a day (BID) | ORAL | 0 refills | Status: AC

## 2020-08-17 NOTE — Discharge Instructions (Addendum)
COVID testing ordered.  It will take between 5-7 days for test results.  Someone will contact you regarding abnormal results.    In the meantime: You should remain isolated in your home for 10 days from symptom onset AND greater than 72 hours after symptoms resolution (absence of fever without the use of fever-reducing medication and improvement in respiratory symptoms), whichever is longer Get plenty of rest and push fluids Use OTC zyrtec for nasal congestion, runny nose, and/or sore throat Use OTC flonase for nasal congestion and runny nose Use medications daily for symptom relief Use OTC medications like ibuprofen or tylenol as needed fever or pain Call or go to the ED if you have any new or worsening symptoms such as fever, worsening cough, shortness of breath, chest tightness, chest pain, turning blue, changes in mental status, etc...  

## 2020-08-17 NOTE — ED Provider Notes (Signed)
[facility]   [id number] 08/17/20 Arrival Time: 1424   CC: COVID symptoms  SUBJECTIVE: History from: patient.  Helen Sims is a 19 y.o. female who presents with sore throat, and right ear pain x 2 days.  Denies sick exposure to COVID, flu or strep.  Has tried OTC medications without relief.  Denies aggravating factors.  Denies previous covid infection in the past.   Denies fever, chills, cough, SOB, wheezing, chest pain, nausea, changes in bowel or bladder habits.    ROS: As per HPI.  All other pertinent ROS negative.     Past Medical History:  Diagnosis Date  . ADHD (attention deficit hyperactivity disorder)   . Insomnia    Past Surgical History:  Procedure Laterality Date  . CHOLECYSTECTOMY N/A 09/21/2019   Procedure: LAPAROSCOPIC CHOLECYSTECTOMY;  Surgeon: Lucretia Roers, MD;  Location: [facility];  Service: General;  Laterality: N/A;  . tubes in ears    . VAGINAL DELIVERY     No Known Allergies No current facility-administered medications on file prior to encounter.   No current outpatient medications on file prior to encounter.   Social History   Socioeconomic History  . Marital status: Single    Spouse name: justin vaughan  . Number of children: 1  . Years of education: 78  . Highest education level: 12th grade  Occupational History  . Occupation: unemployed  Tobacco Use  . Smoking status: Never Smoker  . Smokeless tobacco: Never Used  Vaping Use  . Vaping Use: Never used  Substance and Sexual Activity  . Alcohol use: No  . Drug use: No  . Sexual activity: Yes    Birth control/protection: None  Other Topics Concern  . Not on file  Social History Narrative  . Not on file   Social Determinants of Health   Financial Resource Strain:   . Difficulty of Paying Living Expenses: Not on file  Food Insecurity:   . Worried About Programme researcher, broadcasting/film/video in the Last Year: Not on file  . Ran Out of Food in the Last Year: Not on file  Transportation  Needs:   . Lack of Transportation (Medical): Not on file  . Lack of Transportation (Non-Medical): Not on file  Physical Activity:   . Days of Exercise per Week: Not on file  . Minutes of Exercise per Session: Not on file  Stress:   . Feeling of Stress : Not on file  Social Connections:   . Frequency of Communication with Friends and Family: Not on file  . Frequency of Social Gatherings with Friends and Family: Not on file  . Attends Religious Services: Not on file  . Active Member of Clubs or Organizations: Not on file  . Attends Banker Meetings: Not on file  . Marital Status: Not on file  Intimate Partner Violence:   . Fear of Current or Ex-Partner: Not on file  . Emotionally Abused: Not on file  . Physically Abused: Not on file  . Sexually Abused: Not on file   Family History  Problem Relation Age of Onset  . Hypertension Father   . Hypertension Mother     OBJECTIVE:  Vitals:   08/17/20 1442  BP: 134/89  Pulse: 71  Resp: 16  Temp: 98.8 F (37.1 C)  TempSrc: Oral  SpO2: 97%     General appearance: alert; appears mildly fatigued, but nontoxic; speaking in full sentences and tolerating own secretions HEENT: NCAT; Ears: EACs clear, TMs pearly  gray; Eyes: PERRL.  EOM grossly intact. Nose: nares patent without rhinorrhea, Throat: oropharynx clear, tonsils mildly erythematous and enlarged, uvula midline  Neck: supple without LAD Lungs: unlabored respirations, symmetrical air entry; cough: absent; no respiratory distress; CTAB Heart: regular rate and rhythm.   Skin: warm and dry Psychological: alert and cooperative; normal mood and affect  LABS:  Results for orders placed or performed during the hospital encounter of 08/17/20 (from the past 24 hour(s))  POCT rapid strep A     Status: None   Collection Time: 08/17/20  3:05 PM  Result Value Ref Range   Rapid Strep A Screen Negative Negative     ASSESSMENT & PLAN:  1. Sore throat   2. Ear pressure,  right     Meds ordered this encounter  Medications  . predniSONE (DELTASONE) 20 MG tablet    Sig: Take 1 tablet (20 mg total) by mouth 2 (two) times daily with a meal for 5 days.    Dispense:  10 tablet    Refill:  0    Order Specific Question:   Supervising Provider    Answer:   Eustace Moore [[id number]]    Strep negative.  Culture sent Prednisone for congestion COVID testing ordered.  It will take between 5-7 days for test results.  Someone will contact you regarding abnormal results.    In the meantime: You should remain isolated in your home for 10 days from symptom onset AND greater than 72 hours after symptoms resolution (absence of fever without the use of fever-reducing medication and improvement in respiratory symptoms), whichever is longer Get plenty of rest and push fluids Use OTC zyrtec for nasal congestion, runny nose, and/or sore throat Use OTC flonase for nasal congestion and runny nose Use medications daily for symptom relief Use OTC medications like ibuprofen or tylenol as needed fever or pain Call or go to the ED if you have any new or worsening symptoms such as fever, worsening cough, shortness of breath, chest tightness, chest pain, turning blue, changes in mental status, etc...   Reviewed expectations re: course of current medical issues. Questions answered. Outlined signs and symptoms indicating need for more acute intervention. Patient verbalized understanding. After Visit Summary given.         Rennis Harding, PA-C 08/17/20 1511

## 2020-08-17 NOTE — ED Triage Notes (Signed)
sore throat rt ear pain since monday

## 2020-08-19 LAB — COVID-19, FLU A+B AND RSV
Influenza A, NAA: NOT DETECTED
Influenza B, NAA: NOT DETECTED
RSV, NAA: NOT DETECTED
SARS-CoV-2, NAA: NOT DETECTED

## 2020-08-20 LAB — CULTURE, GROUP A STREP (THRC)

## 2020-12-25 ENCOUNTER — Other Ambulatory Visit: Payer: Self-pay

## 2020-12-25 ENCOUNTER — Ambulatory Visit
Admission: EM | Admit: 2020-12-25 | Discharge: 2020-12-25 | Disposition: A | Discharge status: Home / Self Care | Payer: Medicaid Other | Attending: Physician Assistant | Admitting: Physician Assistant

## 2020-12-25 ENCOUNTER — Encounter: Payer: Self-pay | Admitting: Emergency Medicine

## 2020-12-25 DIAGNOSIS — H6691 Otitis media, unspecified, right ear: Secondary | ICD-10-CM | POA: Diagnosis not present

## 2020-12-25 DIAGNOSIS — R059 Cough, unspecified: Secondary | ICD-10-CM | POA: Diagnosis not present

## 2020-12-25 MED ORDER — AMOXICILLIN-POT CLAVULANATE 875-125 MG PO TABS: 1.0000 | ORAL_TABLET | Freq: Two times a day (BID) | ORAL | 0 refills | Status: DC

## 2020-12-25 NOTE — ED Triage Notes (Signed)
RT ear pain, cough, sinus congestion, fatigue, chills, body aches for the past few days.

## 2020-12-25 NOTE — ED Provider Notes (Signed)
[facility]    CSN: [id number] Arrival date & time: 12/25/20  1333      History   Chief Complaint Chief Complaint  Patient presents with  . Cough    HPI Helen Sims is a 20 y.o. female.   Pt complains of right ear pain that started several days ago.  She also reports cough, congestion, fatigue, subjective fever chills, and body aches. She denies difficulty with hearing.  She has taken nothing for the sx.  She has not had her COVID vaccine.  Denies sick contacts.      Past Medical History:  Diagnosis Date  . ADHD (attention deficit hyperactivity disorder)   . Insomnia     Patient Active Problem List   Diagnosis Date Noted  . Calculus of gallbladder without cholecystitis without obstruction 09/07/2019  . History of gestational hypertension 08/27/2019    Past Surgical History:  Procedure Laterality Date  . CHOLECYSTECTOMY N/A 09/21/2019   Procedure: LAPAROSCOPIC CHOLECYSTECTOMY;  Surgeon: Lucretia Roers, MD;  Location: [facility];  Service: General;  Laterality: N/A;  . tubes in ears    . VAGINAL DELIVERY      OB History    Gravida  1   Para  1   Term  1   Preterm      AB      Living  1     SAB      IAB      Ectopic      Multiple  0   Live Births  1            Home Medications    Prior to Admission medications   Medication Sig Start Date End Date Taking? Authorizing Provider  amoxicillin-clavulanate (AUGMENTIN) 875-125 MG tablet Take 1 tablet by mouth 2 (two) times daily for 5 days. 12/25/20 12/30/20 Yes Jodell Cipro, PA-C    Family History Family History  Problem Relation Age of Onset  . Hypertension Father   . Hypertension Mother     Social History Social History   Tobacco Use  . Smoking status: Never Smoker  . Smokeless tobacco: Never Used  Vaping Use  . Vaping Use: Never used  Substance Use Topics  . Alcohol use: No  . Drug use: No     Allergies   Patient has no known allergies.   Review of  Systems Review of Systems  Constitutional: Positive for chills and fever.  HENT: Positive for congestion and ear pain. Negative for sore throat.   Eyes: Negative for pain and visual disturbance.  Respiratory: Positive for cough. Negative for shortness of breath.   Cardiovascular: Negative for chest pain and palpitations.  Gastrointestinal: Negative for abdominal pain and vomiting.  Genitourinary: Negative for dysuria and hematuria.  Musculoskeletal: Negative for arthralgias and back pain.  Skin: Negative for color change and rash.  Neurological: Negative for seizures and syncope.  All other systems reviewed and are negative.    Physical Exam Triage Vital Signs ED Triage Vitals  Enc Vitals Group     BP 12/25/20 1357 115/78     Pulse Rate 12/25/20 1357 99     Resp 12/25/20 1357 19     Temp 12/25/20 1357 98.3 F (36.8 C)     Temp Source 12/25/20 1357 Oral     SpO2 12/25/20 1357 97 %     Weight --      Height --      Head Circumference --  Peak Flow --      Pain Score 12/25/20 1355 0     Pain Loc --      Pain Edu? --      Excl. in GC? --    No data found.  Updated Vital Signs BP 115/78 (BP Location: Right Arm)   Pulse 99   Temp 98.3 F (36.8 C) (Oral)   Resp 19   LMP 12/06/2020   SpO2 97%   Visual Acuity Right Eye Distance:   Left Eye Distance:   Bilateral Distance:    Right Eye Near:   Left Eye Near:    Bilateral Near:     Physical Exam Vitals and nursing note reviewed.  Constitutional:      General: She is not in acute distress.    Appearance: She is well-developed.  HENT:     Head: Normocephalic and atraumatic.     Right Ear: Hearing normal. Tympanic membrane is erythematous and bulging.     Left Ear: Hearing and tympanic membrane normal.  Eyes:     Conjunctiva/sclera: Conjunctivae normal.  Cardiovascular:     Rate and Rhythm: Normal rate and regular rhythm.     Heart sounds: No murmur heard.   Pulmonary:     Effort: Pulmonary effort is  normal. No respiratory distress.     Breath sounds: Normal breath sounds.  Abdominal:     Palpations: Abdomen is soft.     Tenderness: There is no abdominal tenderness.  Musculoskeletal:     Cervical back: Neck supple.  Skin:    General: Skin is warm and dry.  Neurological:     Mental Status: She is alert.      UC Treatments / Results  Labs (all labs ordered are listed, but only abnormal results are displayed) Labs Reviewed  COVID-19, FLU A+B NAA    EKG   Radiology No results found.  Procedures Procedures (including critical care time)  Medications Ordered in UC Medications - No data to display  Initial Impression / Assessment and Plan / UC Course  I have reviewed the triage vital signs and the nursing notes.  Pertinent labs & imaging results that were available during my care of the patient were reviewed by me and considered in my medical decision making (see chart for details).     Will treat right otitis media.   COVID/flu test pending.  Recommend flonase, zyrtec, ibuprofen or tylenol as needed. Self isolation instructions given if COVID test is positive. Return precautions discussed.  Final Clinical Impressions(s) / UC Diagnoses   Final diagnoses:  Cough  Right otitis media, unspecified otitis media type     Discharge Instructions     Take medication as prescribed Recommend Flonase and daily Zyrtec Take ibuprofen or tylenol as needed for fever, body aches, headache COVID test pending If positive isolate for 5 days and then wear a mask around others for 5 days.    ED Prescriptions    Medication Sig Dispense Auth. Provider   amoxicillin-clavulanate (AUGMENTIN) 875-125 MG tablet Take 1 tablet by mouth 2 (two) times daily for 5 days. 10 tablet Jodell Cipro, PA-C     PDMP not reviewed this encounter.   Jodell Cipro, PA-C 12/25/20 1418

## 2020-12-25 NOTE — Discharge Instructions (Addendum)
Take medication as prescribed Recommend Flonase and daily Zyrtec Take ibuprofen or tylenol as needed for fever, body aches, headache COVID test pending If positive isolate for 5 days and then wear a mask around others for 5 days.

## 2020-12-26 LAB — COVID-19, FLU A+B NAA
Influenza A, NAA: NOT DETECTED
Influenza B, NAA: NOT DETECTED
SARS-CoV-2, NAA: NOT DETECTED

## 2020-12-28 DIAGNOSIS — Z13220 Encounter for screening for lipoid disorders: Secondary | ICD-10-CM | POA: Diagnosis not present

## 2020-12-28 DIAGNOSIS — F418 Other specified anxiety disorders: Secondary | ICD-10-CM | POA: Diagnosis not present

## 2020-12-28 DIAGNOSIS — Z131 Encounter for screening for diabetes mellitus: Secondary | ICD-10-CM | POA: Diagnosis not present

## 2020-12-29 ENCOUNTER — Ambulatory Visit
Admission: EM | Admit: 2020-12-29 | Discharge: 2020-12-29 | Disposition: A | Discharge status: Home / Self Care | Payer: Medicaid Other | Attending: Internal Medicine | Admitting: Internal Medicine

## 2020-12-29 ENCOUNTER — Encounter: Payer: Self-pay | Admitting: Emergency Medicine

## 2020-12-29 ENCOUNTER — Other Ambulatory Visit: Payer: Self-pay

## 2020-12-29 DIAGNOSIS — J069 Acute upper respiratory infection, unspecified: Secondary | ICD-10-CM

## 2020-12-29 DIAGNOSIS — H6693 Otitis media, unspecified, bilateral: Secondary | ICD-10-CM | POA: Diagnosis not present

## 2020-12-29 MED ORDER — AMOXICILLIN-POT CLAVULANATE 875-125 MG PO TABS
1.0000 | ORAL_TABLET | Freq: Two times a day (BID) | ORAL | 0 refills | Status: DC
Start: 2020-12-29 — End: 2022-04-27

## 2020-12-29 NOTE — Discharge Instructions (Signed)
Follow up with your primary care Dr in 15 days to make sure your ear drum is fully healed

## 2020-12-29 NOTE — ED Provider Notes (Signed)
[facility]    CSN: [id number] Arrival date & time: 12/29/20  1849      History   Chief Complaint No chief complaint on file.   HPI Helen Sims is a 20 y.o. female who has been having bilateral ear pain x 5 days  and is getting worse. She started with nose congestion, sneezing and cough 5 days ago.  Had a fever of 102 yesterday.   Past Medical History:  Diagnosis Date  . ADHD (attention deficit hyperactivity disorder)   . Insomnia     Patient Active Problem List   Diagnosis Date Noted  . Calculus of gallbladder without cholecystitis without obstruction 09/07/2019  . History of gestational hypertension 08/27/2019    Past Surgical History:  Procedure Laterality Date  . CHOLECYSTECTOMY N/A 09/21/2019   Procedure: LAPAROSCOPIC CHOLECYSTECTOMY;  Surgeon: Lucretia Roers, MD;  Location: [facility];  Service: General;  Laterality: N/A;  . tubes in ears    . VAGINAL DELIVERY      OB History    Gravida  1   Para  1   Term  1   Preterm      AB      Living  1     SAB      IAB      Ectopic      Multiple  0   Live Births  1            Home Medications    Prior to Admission medications   Medication Sig Start Date End Date Taking? Authorizing Provider  amoxicillin-clavulanate (AUGMENTIN) 875-125 MG tablet Take 1 tablet by mouth 2 (two) times daily. 12/29/20  Yes Rodriguez-Southworth, Nettie Elm, PA-C    Family History Family History  Problem Relation Age of Onset  . Hypertension Father   . Hypertension Mother     Social History Social History   Tobacco Use  . Smoking status: Never Smoker  . Smokeless tobacco: Never Used  Vaping Use  . Vaping Use: Never used  Substance Use Topics  . Alcohol use: No  . Drug use: No     Allergies   Patient has no known allergies.   Review of Systems Review of Systems  Constitutional: Positive for fatigue and fever. Negative for activity change and appetite change.  HENT: Positive for  congestion, ear pain, hearing loss, postnasal drip and rhinorrhea. Negative for sore throat and trouble swallowing.        Saw blood from R ear  Eyes: Negative for discharge.  Respiratory: Positive for cough.   Gastrointestinal: Negative for diarrhea, nausea and vomiting.  Musculoskeletal: Negative for gait problem and myalgias.  Skin: Negative for rash.  Neurological: Negative for headaches.  Hematological: Negative for adenopathy.     Physical Exam Triage Vital Signs ED Triage Vitals  Enc Vitals Group     BP 12/29/20 1913 117/81     Pulse Rate 12/29/20 1913 81     Resp 12/29/20 1913 18     Temp 12/29/20 1913 97.9 F (36.6 C)     Temp Source 12/29/20 1913 Oral     SpO2 12/29/20 1913 97 %     Weight --      Height --      Head Circumference --      Peak Flow --      Pain Score 12/29/20 1921 8     Pain Loc --      Pain Edu? --  Excl. in GC? --    No data found.  Updated Vital Signs BP 117/81   Pulse 81   Temp 97.9 F (36.6 C) (Oral)   Resp 18   LMP 12/06/2020   SpO2 97%   Visual Acuity Right Eye Distance:   Left Eye Distance:   Bilateral Distance:    Right Eye Near:   Left Eye Near:    Bilateral Near:     Physical Exam Vitals and nursing note reviewed.  Constitutional:      General: She is not in acute distress.    Appearance: She is obese. She is not toxic-appearing.  HENT:     Head: Normocephalic.     Right Ear: Ear canal and external ear normal.     Left Ear: Ear canal and external ear normal.     Ears:     Comments: L TM erythema with mild bulging R TM with erythema and blood on R upper area which possible perforation, but none in canal.     Nose: Congestion present.  Eyes:     General: No scleral icterus.    Conjunctiva/sclera: Conjunctivae normal.  Cardiovascular:     Rate and Rhythm: Normal rate and regular rhythm.     Pulses: Normal pulses.  Pulmonary:     Effort: Pulmonary effort is normal.     Breath sounds: Normal breath sounds.   Musculoskeletal:     Cervical back: Neck supple.  Neurological:     Mental Status: She is alert and oriented to person, place, and time.     Gait: Gait normal.  Psychiatric:        Mood and Affect: Mood normal.        Behavior: Behavior normal.        Thought Content: Thought content normal.        Judgment: Judgment normal.      UC Treatments / Results  Labs (all labs ordered are listed, but only abnormal results are displayed) Labs Reviewed - No data to display  EKG   Radiology No results found.  Procedures Procedures (including critical care time)  Medications Ordered in UC Medications - No data to display  Initial Impression / Assessment and Plan / UC Course  I have reviewed the triage vital signs and the nursing notes. URI, BOM. I placed her on Allegra and Augmentin as noted.  Final Clinical Impressions(s) / UC Diagnoses   Final diagnoses:  Acute otitis media, bilateral  Acute upper respiratory infection     Discharge Instructions     Follow up with your primary care Dr in 15 days to make sure your ear drum is fully healed     ED Prescriptions    Medication Sig Dispense Auth. Provider   amoxicillin-clavulanate (AUGMENTIN) 875-125 MG tablet Take 1 tablet by mouth 2 (two) times daily. 20 tablet Rodriguez-Southworth, Nettie Elm, PA-C     PDMP not reviewed this encounter.   Garey Ham, [place] 12/29/20 1959

## 2020-12-29 NOTE — ED Triage Notes (Signed)
Bilateral ear pain that is getting worse

## 2021-01-25 DIAGNOSIS — F419 Anxiety disorder, unspecified: Secondary | ICD-10-CM | POA: Diagnosis not present

## 2021-01-25 DIAGNOSIS — E559 Vitamin D deficiency, unspecified: Secondary | ICD-10-CM | POA: Diagnosis not present

## 2021-01-25 DIAGNOSIS — F32A Depression, unspecified: Secondary | ICD-10-CM | POA: Diagnosis not present

## 2021-03-07 DIAGNOSIS — F419 Anxiety disorder, unspecified: Secondary | ICD-10-CM | POA: Diagnosis not present

## 2021-03-07 DIAGNOSIS — F32A Depression, unspecified: Secondary | ICD-10-CM | POA: Diagnosis not present

## 2021-03-07 DIAGNOSIS — E559 Vitamin D deficiency, unspecified: Secondary | ICD-10-CM | POA: Diagnosis not present

## 2021-04-05 DIAGNOSIS — Z20822 Contact with and (suspected) exposure to covid-19: Secondary | ICD-10-CM | POA: Diagnosis not present

## 2021-04-18 DIAGNOSIS — G43909 Migraine, unspecified, not intractable, without status migrainosus: Secondary | ICD-10-CM | POA: Diagnosis not present

## 2021-04-18 DIAGNOSIS — F32A Depression, unspecified: Secondary | ICD-10-CM | POA: Diagnosis not present

## 2021-06-26 DIAGNOSIS — G43909 Migraine, unspecified, not intractable, without status migrainosus: Secondary | ICD-10-CM | POA: Diagnosis not present

## 2021-08-02 DIAGNOSIS — R42 Dizziness and giddiness: Secondary | ICD-10-CM | POA: Diagnosis not present

## 2021-08-02 DIAGNOSIS — F32A Depression, unspecified: Secondary | ICD-10-CM | POA: Diagnosis not present

## 2021-08-02 DIAGNOSIS — E559 Vitamin D deficiency, unspecified: Secondary | ICD-10-CM | POA: Diagnosis not present

## 2021-08-17 DIAGNOSIS — E559 Vitamin D deficiency, unspecified: Secondary | ICD-10-CM | POA: Diagnosis not present

## 2021-08-17 DIAGNOSIS — F32A Depression, unspecified: Secondary | ICD-10-CM | POA: Diagnosis not present

## 2021-08-17 DIAGNOSIS — R42 Dizziness and giddiness: Secondary | ICD-10-CM | POA: Diagnosis not present

## 2022-04-27 ENCOUNTER — Emergency Department (HOSPITAL_COMMUNITY)
Admission: EM | Admit: 2022-04-27 | Discharge: 2022-04-27 | Disposition: A | Discharge status: Home / Self Care | Payer: Medicaid Other | Attending: Emergency Medicine | Admitting: Emergency Medicine

## 2022-04-27 ENCOUNTER — Other Ambulatory Visit: Payer: Self-pay

## 2022-04-27 ENCOUNTER — Ambulatory Visit
Admission: EM | Admit: 2022-04-27 | Discharge: 2022-04-27 | Disposition: A | Discharge status: Other Acute Inpatient Hospital | Payer: Medicaid Other | Attending: Urgent Care | Admitting: Urgent Care

## 2022-04-27 ENCOUNTER — Emergency Department (HOSPITAL_COMMUNITY): Payer: Medicaid Other

## 2022-04-27 ENCOUNTER — Ambulatory Visit: Payer: Self-pay

## 2022-04-27 ENCOUNTER — Encounter (HOSPITAL_COMMUNITY): Payer: Self-pay | Admitting: Emergency Medicine

## 2022-04-27 ENCOUNTER — Encounter: Payer: Self-pay | Admitting: Emergency Medicine

## 2022-04-27 DIAGNOSIS — R0789 Other chest pain: Secondary | ICD-10-CM

## 2022-04-27 DIAGNOSIS — R1031 Right lower quadrant pain: Secondary | ICD-10-CM | POA: Diagnosis not present

## 2022-04-27 DIAGNOSIS — D72829 Elevated white blood cell count, unspecified: Secondary | ICD-10-CM | POA: Diagnosis not present

## 2022-04-27 DIAGNOSIS — R9431 Abnormal electrocardiogram [ECG] [EKG]: Secondary | ICD-10-CM | POA: Diagnosis not present

## 2022-04-27 LAB — POCT URINALYSIS DIP (MANUAL ENTRY)
Bilirubin, UA: NEGATIVE
Blood, UA: NEGATIVE
Glucose, UA: NEGATIVE mg/dL
Ketones, POC UA: NEGATIVE mg/dL
Nitrite, UA: NEGATIVE
Spec Grav, UA: 1.02 (ref 1.010–1.025)
Urobilinogen, UA: 1 E.U./dL
pH, UA: 6.5 (ref 5.0–8.0)

## 2022-04-27 LAB — CBC
HCT: 42.4 % (ref 36.0–46.0)
Hemoglobin: 13.6 g/dL (ref 12.0–15.0)
MCH: 26.9 pg (ref 26.0–34.0)
MCHC: 32.1 g/dL (ref 30.0–36.0)
MCV: 84 fL (ref 80.0–100.0)
Platelets: 376 10*3/uL (ref 150–400)
RBC: 5.05 MIL/uL (ref 3.87–5.11)
RDW: 13.4 % (ref 11.5–15.5)
WBC: 11.3 10*3/uL — ABNORMAL HIGH (ref 4.0–10.5)
nRBC: 0 % (ref 0.0–0.2)

## 2022-04-27 LAB — COMPREHENSIVE METABOLIC PANEL
ALT: 14 U/L (ref 0–44)
AST: 13 U/L — ABNORMAL LOW (ref 15–41)
Albumin: 4.1 g/dL (ref 3.5–5.0)
Alkaline Phosphatase: 66 U/L (ref 38–126)
Anion gap: 7 (ref 5–15)
BUN: 15 mg/dL (ref 6–20)
CO2: 25 mmol/L (ref 22–32)
Calcium: 9 mg/dL (ref 8.9–10.3)
Chloride: 105 mmol/L (ref 98–111)
Creatinine, Ser: 0.74 mg/dL (ref 0.44–1.00)
GFR, Estimated: >60 mL/min (ref >60)
Glucose, Bld: 91 mg/dL (ref 70–99)
Potassium: 3.7 mmol/L (ref 3.5–5.1)
Sodium: 137 mmol/L (ref 135–145)
Total Bilirubin: 0.5 mg/dL (ref 0.3–1.2)
Total Protein: 7.3 g/dL (ref 6.5–8.1)

## 2022-04-27 LAB — URINALYSIS, ROUTINE W REFLEX MICROSCOPIC
Bilirubin Urine: NEGATIVE
Glucose, UA: NEGATIVE mg/dL
Hgb urine dipstick: NEGATIVE
Ketones, ur: NEGATIVE mg/dL
Nitrite: NEGATIVE
Protein, ur: 30 mg/dL — AB
Specific Gravity, Urine: 1.029 (ref 1.005–1.030)
pH: 5 (ref 5.0–8.0)

## 2022-04-27 LAB — LIPASE, BLOOD: Lipase: 24 U/L (ref 11–51)

## 2022-04-27 LAB — POCT URINE PREGNANCY: Preg Test, Ur: NEGATIVE

## 2022-04-27 MED ORDER — IOHEXOL 300 MG/ML  SOLN
100.0000 mL | Freq: Once | INTRAMUSCULAR | Status: AC | PRN
  Administered 2022-04-27: 100 mL via INTRAVENOUS

## 2022-04-27 MED ORDER — IBUPROFEN 800 MG PO TABS
800.0000 mg | ORAL_TABLET | Freq: Once | ORAL | Status: AC
  Administered 2022-04-27: 800 mg via ORAL

## 2022-04-27 NOTE — ED Provider Notes (Signed)
[facility]   MRN: [id number] DOB: April 10, 2001  Subjective:   Helen Sims is a 21 y.o. female with PMH of cholecystectomy presenting for 5-day history of acute onset persistent and worsening now constant right lower quadrant abdominal pain with nausea.  No vomiting.  Has felt feverish as well and has had intermittent chest tightness and chest discomfort.  No cough, shortness of breath, diarrhea, constipation, vaginal discharge, dysuria, urinary frequency, hematuria.  Patient is not as concerned for an STI.  No current facility-administered medications for this encounter. No current outpatient medications on file.   No Known Allergies  Past Medical History:  Diagnosis Date   ADHD (attention deficit hyperactivity disorder)    Insomnia      Past Surgical History:  Procedure Laterality Date   CHOLECYSTECTOMY N/A 09/21/2019   Procedure: LAPAROSCOPIC CHOLECYSTECTOMY;  Surgeon: Lucretia Roers, MD;  Location: [facility];  Service: General;  Laterality: N/A;   tubes in ears     VAGINAL DELIVERY      Family History  Problem Relation Age of Onset   Hypertension Father    Hypertension Mother     Social History   Tobacco Use   Smoking status: Never   Smokeless tobacco: Never  Vaping Use   Vaping Use: Never used  Substance Use Topics   Alcohol use: No   Drug use: No    ROS   Objective:   Vitals: BP (!) 136/90 (BP Location: Right Arm)   Pulse (!) 102   Temp 99.1 F (37.3 C) (Oral)   Resp 18   SpO2 95%   Physical Exam Constitutional:      General: She is not in acute distress.    Appearance: Normal appearance. She is well-developed. She is not ill-appearing, toxic-appearing or diaphoretic.  HENT:     Head: Normocephalic and atraumatic.     Nose: Nose normal.     Mouth/Throat:     Mouth: Mucous membranes are moist.     Pharynx: Oropharynx is clear.  Eyes:     General: No scleral icterus.       Right eye: No discharge.        Left eye: No  discharge.     Extraocular Movements: Extraocular movements intact.     Conjunctiva/sclera: Conjunctivae normal.  Cardiovascular:     Rate and Rhythm: Normal rate and regular rhythm.     Heart sounds: Normal heart sounds. No murmur heard.    No friction rub. No gallop.  Pulmonary:     Effort: Pulmonary effort is normal. No respiratory distress.     Breath sounds: No stridor. No wheezing, rhonchi or rales.  Chest:     Chest wall: No tenderness.  Abdominal:     General: Bowel sounds are normal. There is no distension.     Palpations: Abdomen is soft. There is no mass.     Tenderness: There is abdominal tenderness in the right lower quadrant and periumbilical area. There is guarding (for RLQ pain). There is no right CVA tenderness, left CVA tenderness or rebound.  Skin:    General: Skin is warm and dry.  Neurological:     General: No focal deficit present.     Mental Status: She is alert and oriented to person, place, and time.  Psychiatric:        Mood and Affect: Mood normal.        Behavior: Behavior normal.        Thought Content: Thought content normal.  Judgment: Judgment normal.     Results for orders placed or performed during the hospital encounter of 04/27/22 (from the past 24 hour(s))  POCT urine pregnancy     Status: None   Collection Time: 04/27/22  2:10 PM  Result Value Ref Range   Preg Test, Ur Negative Negative  POCT urinalysis dipstick     Status: Abnormal   Collection Time: 04/27/22  2:11 PM  Result Value Ref Range   Color, UA yellow yellow   Clarity, UA clear clear   Glucose, UA negative negative mg/dL   Bilirubin, UA negative negative   Ketones, POC UA negative negative mg/dL   Spec Grav, UA 3.825 0.539 - 1.025   Blood, UA negative negative   pH, UA 6.5 5.0 - 8.0   Protein Ur, POC trace (A) negative mg/dL   Urobilinogen, UA 1.0 0.2 or 1.0 E.U./dL   Nitrite, UA Negative Negative   Leukocytes, UA Trace (A) Negative    Assessment and Plan :    PDMP not reviewed this encounter.  1. Right lower quadrant abdominal pain   2. Atypical chest pain     Patient warrants a higher level of evaluation and intervention than we can provide in the urgent care setting including rule out of an acute abdomen, acute appendicitis versus acute gynecologic emergency. She was given 800mg  ibuprofen, declined IM Toradol. Patient is appropriate to present to the emergency room by personal vehicle.    , PA-C 04/27/22 1425

## 2022-04-27 NOTE — ED Triage Notes (Signed)
Pt here for mid to RLQ pain x 5 days worse with movement

## 2022-04-27 NOTE — ED Provider Notes (Signed)
[facility] EMERGENCY DEPARTMENT Provider Note   CSN: [id number] Arrival date & time: 04/27/22  1547     History Chief Complaint  Patient presents with   Abdominal Pain    Helen Sims is a 21 y.o. female with history of cholecystectomy who presents to the emergency department with a 5-day history of right lower quadrant pain.  She describes the pain as sharp and has been waxing and waning since onset.  This started around the time of her menstrual cycle.  Menstrual cycle is typically irregular and this was not abnormal and frequency or duration.  She reports associated subjective fever and nausea but denies any diarrhea, vomiting, shortness of breath, vaginal discharge, dysuria, urinary frequency, or hematuria.  Patient was seen evaluated at urgent care prior to arrival and was sent to the emergency department to rule out appendicitis.   Abdominal Pain      Home Medications Prior to Admission medications   Not on File      Allergies    Patient has no known allergies.    Review of Systems   Review of Systems  Gastrointestinal:  Positive for abdominal pain.  All other systems reviewed and are negative.   Physical Exam Updated Vital Signs BP (!) 140/80 (BP Location: Right Arm)   Pulse 88   Temp 98.5 F (36.9 C) (Oral)   Resp 16   Ht 5\' 3"  (1.6 m)   Wt 116.2 kg   LMP 04/23/2022 (Approximate)   SpO2 100%   BMI 45.39 kg/m  Physical Exam Vitals and nursing note reviewed.  Constitutional:      General: She is not in acute distress.    Appearance: Normal appearance.  HENT:     Head: Normocephalic and atraumatic.  Eyes:     General:        Right eye: No discharge.        Left eye: No discharge.  Cardiovascular:     Comments: Regular rate and rhythm.  S1/S2 are distinct without any evidence of murmur, rubs, or gallops.  Radial pulses are 2+ bilaterally.  Dorsalis pedis pulses are 2+ bilaterally.  No evidence of pedal edema. Pulmonary:     Comments: Clear to  auscultation bilaterally.  Normal effort.  No respiratory distress.  No evidence of wheezes, rales, or rhonchi heard throughout. Abdominal:     General: Abdomen is flat. Bowel sounds are normal. There is no distension.     Tenderness: There is abdominal tenderness in the right lower quadrant. There is no guarding or rebound. Positive signs include Rovsing's sign.     Comments: Obese abdomen.  Musculoskeletal:        General: Normal range of motion.     Cervical back: Neck supple.  Skin:    General: Skin is warm and dry.     Findings: No rash.  Neurological:     General: No focal deficit present.     Mental Status: She is alert.  Psychiatric:        Mood and Affect: Mood normal.        Behavior: Behavior normal.     ED Results / Procedures / Treatments   Labs (all labs ordered are listed, but only abnormal results are displayed) Labs Reviewed  COMPREHENSIVE METABOLIC PANEL - Abnormal; Notable for the following components:      Result Value   AST 13 (*)    All other components within normal limits  CBC - Abnormal; Notable for the following components:  WBC 11.3 (*)    All other components within normal limits  URINALYSIS, ROUTINE W REFLEX MICROSCOPIC - Abnormal; Notable for the following components:   APPearance HAZY (*)    Protein, ur 30 (*)    Leukocytes,Ua LARGE (*)    Bacteria, UA RARE (*)    All other components within normal limits  LIPASE, BLOOD    EKG None  Radiology CT ABDOMEN PELVIS W CONTRAST  Result Date: 04/27/2022 CLINICAL DATA:  Right lower quadrant pain and right flank pain for 1 week. EXAM: CT ABDOMEN AND PELVIS WITH CONTRAST TECHNIQUE: Multidetector CT imaging of the abdomen and pelvis was performed using the standard protocol following bolus administration of intravenous contrast. RADIATION DOSE REDUCTION: This exam was performed according to the departmental dose-optimization program which includes automated exposure control, adjustment of the mA  and/or kV according to patient size and/or use of iterative reconstruction technique. CONTRAST:  OMNIPAQUE IOHEXOL 300 MG/ML  SOLN COMPARISON:  None Available. FINDINGS: Lower Chest: No acute findings. Hepatobiliary: No hepatic masses identified. Prior cholecystectomy. No evidence of biliary obstruction. Pancreas:  No mass or inflammatory changes. Spleen: Within normal limits in size and appearance. Adrenals/Urinary Tract: No masses identified. No evidence of ureteral calculi or hydronephrosis. Stomach/Bowel: No evidence of obstruction, inflammatory process or abnormal fluid collections. Vascular/Lymphatic: No pathologically enlarged lymph nodes. No acute vascular findings. Reproductive:  No mass or other significant abnormality. Other:  None. Musculoskeletal:  No suspicious bone lesions identified. IMPRESSION: Negative. No evidence of appendicitis, hydronephrosis, or other acute findings. Electronically Signed   By: Danae Orleans M.D.   On: 04/27/2022 19:16    Procedures Procedures    Medications Ordered in ED Medications  iohexol (OMNIPAQUE) 300 MG/ML solution 100 mL (100 mLs Intravenous Contrast Given 04/27/22 1850)    ED Course/ Medical Decision Making/ A&P Clinical Course as of 04/27/22 1929  Fri Apr 27, 2022  1926 CBC(!) There is evidence of leukocytosis.  No signs of anemia. [CF]  1926 Comprehensive metabolic panel(!) No evidence of electrolyte abnormalities.  Creatinine is normal.  No signs of liver damage. [CF]  1926 Urinalysis, Routine w reflex microscopic Urine, Clean Catch(!) There is good amount of leukocytes and white blood cells but there is sign of contamination considering the squamous epithelial cells. [CF]  1927 Lipase, blood Normal. [CF]  1927 CT ABDOMEN PELVIS W CONTRAST I personally ordered interpreted CT abdomen pelvis with contrast which does not show any signs of appendicitis.  I do agree with the radiologist interpretation. [CF]  1927 On reevaluation, patient's  pain is improved.  I discussed the findings of the labs and imaging with her at the bedside.  All questions or concerns addressed. [CF]    Clinical Course User Index [CF] Teressa Lower, PA-C                           Medical Decision Making JAKI STEPTOE is a 21 y.o. female patient who presents to the emergency department today for further evaluation of right lower quadrant abdominal pain.  Patient does have positive Rovsing sign and I am concerned for possible appendicitis.  Labs ordered in triage.  I personally reviewed them and there is evidence of leukocytosis.  No signs of UTI.  I will add on a CT abdomen pelvis with contrast to further evaluate.  We will plan to reassess.  Offered the patient pain medication.  She currently declined.  As mentioned in ED course, patient  feeling better.  No signs of acute abdomen today.  Unclear etiology exactly what is causing her pain but I am suspecting this is related to her menstrual cycle.  I will have her on an ibuprofen regiment and have her follow-up with her primary care doctor for further evaluation.  Patient amenable this plan.  Strict return precaution discussed.  All questions or concerns addressed.  She is safe for discharge at this time.   Amount and/or Complexity of Data Reviewed Labs: ordered. Decision-making details documented in ED Course. Radiology: ordered. Decision-making details documented in ED Course.  Risk Prescription drug management.   Final Clinical Impression(s) / ED Diagnoses Final diagnoses:  Right lower quadrant abdominal pain    Rx / DC Orders ED Discharge Orders     None         Teressa Lower, PA-C 04/27/22 1929    Phoebe Sharps, DO 04/28/22 0020

## 2022-04-27 NOTE — Discharge Instructions (Addendum)
Please head to the emergency room as I am concerned that you have an acute abdomen or possible appendicitis given your constant severe right lower quadrant abdominal pain. This requires a higher level of evaluation and intervention than we can provide in the urgent care setting. For instance through the emergency room a CT scan of the abdomen can be done to rule out an acute abdomen. Please head straight there.

## 2022-04-27 NOTE — ED Triage Notes (Signed)
Pt presents with abdominal pain x 5 days, doesn't have gallbladder.

## 2022-04-27 NOTE — ED Notes (Signed)
Patient is being discharged from the Urgent Care and sent to the Emergency Department via POV . Per MM, patient is in need of higher level of care due to RLQ pain. Patient is aware and verbalizes understanding of plan of care.  Vitals:   04/27/22 1406  BP: (!) 136/90  Pulse: (!) 102  Resp: 18  Temp: 99.1 F (37.3 C)  SpO2: 95%

## 2022-04-27 NOTE — Discharge Instructions (Signed)
Follow-up with your primary care doctor in the next week.  I would like for you to take 600 mg ibuprofen every 6 hours as needed for pain.  Please return to the emergency department for any worsening symptoms you might have.

## 2022-05-07 DIAGNOSIS — F418 Other specified anxiety disorders: Secondary | ICD-10-CM | POA: Diagnosis not present

## 2023-02-26 DIAGNOSIS — G43909 Migraine, unspecified, not intractable, without status migrainosus: Secondary | ICD-10-CM | POA: Diagnosis not present

## 2023-03-16 ENCOUNTER — Other Ambulatory Visit: Payer: Self-pay

## 2023-03-16 ENCOUNTER — Encounter (HOSPITAL_COMMUNITY): Payer: Self-pay

## 2023-03-16 ENCOUNTER — Emergency Department (HOSPITAL_COMMUNITY)
Admission: EM | Admit: 2023-03-16 | Discharge: 2023-03-16 | Disposition: A | Discharge status: Home / Self Care | Payer: Medicaid Other | Attending: Emergency Medicine | Admitting: Emergency Medicine

## 2023-03-16 DIAGNOSIS — E876 Hypokalemia: Secondary | ICD-10-CM | POA: Diagnosis not present

## 2023-03-16 DIAGNOSIS — R1030 Lower abdominal pain, unspecified: Secondary | ICD-10-CM | POA: Diagnosis not present

## 2023-03-16 DIAGNOSIS — R202 Paresthesia of skin: Secondary | ICD-10-CM | POA: Diagnosis not present

## 2023-03-16 LAB — CBC WITH DIFFERENTIAL/PLATELET
Abs Immature Granulocytes: 0.03 10*3/uL (ref 0.00–0.07)
Basophils Absolute: 0.1 10*3/uL (ref 0.0–0.1)
Basophils Relative: 1 %
Eosinophils Absolute: 0.3 10*3/uL (ref 0.0–0.5)
Eosinophils Relative: 3 %
HCT: 39.6 % (ref 36.0–46.0)
Hemoglobin: 12.8 g/dL (ref 12.0–15.0)
Immature Granulocytes: 0 %
Lymphocytes Relative: 33 %
Lymphs Abs: 3.6 10*3/uL (ref 0.7–4.0)
MCH: 26.9 pg (ref 26.0–34.0)
MCHC: 32.3 g/dL (ref 30.0–36.0)
MCV: 83.2 fL (ref 80.0–100.0)
Monocytes Absolute: 0.9 10*3/uL (ref 0.1–1.0)
Monocytes Relative: 8 %
Neutro Abs: 5.9 10*3/uL (ref 1.7–7.7)
Neutrophils Relative %: 55 %
Platelets: 330 10*3/uL (ref 150–400)
RBC: 4.76 MIL/uL (ref 3.87–5.11)
RDW: 13.5 % (ref 11.5–15.5)
WBC: 10.8 10*3/uL — ABNORMAL HIGH (ref 4.0–10.5)
nRBC: 0 % (ref 0.0–0.2)

## 2023-03-16 LAB — URINALYSIS, ROUTINE W REFLEX MICROSCOPIC
Bilirubin Urine: NEGATIVE
Glucose, UA: NEGATIVE mg/dL
Ketones, ur: NEGATIVE mg/dL
Nitrite: NEGATIVE
Protein, ur: 30 mg/dL — AB
Specific Gravity, Urine: 1.017 (ref 1.005–1.030)
pH: 5 (ref 5.0–8.0)

## 2023-03-16 LAB — COMPREHENSIVE METABOLIC PANEL
ALT: 14 U/L (ref 0–44)
AST: 13 U/L — ABNORMAL LOW (ref 15–41)
Albumin: 3.9 g/dL (ref 3.5–5.0)
Alkaline Phosphatase: 59 U/L (ref 38–126)
Anion gap: 11 (ref 5–15)
BUN: 12 mg/dL (ref 6–20)
CO2: 20 mmol/L — ABNORMAL LOW (ref 22–32)
Calcium: 8.6 mg/dL — ABNORMAL LOW (ref 8.9–10.3)
Chloride: 106 mmol/L (ref 98–111)
Creatinine, Ser: 0.86 mg/dL (ref 0.44–1.00)
GFR, Estimated: >60 mL/min (ref >60)
Glucose, Bld: 87 mg/dL (ref 70–99)
Potassium: 3 mmol/L — ABNORMAL LOW (ref 3.5–5.1)
Sodium: 137 mmol/L (ref 135–145)
Total Bilirubin: 0.7 mg/dL (ref 0.3–1.2)
Total Protein: 7.7 g/dL (ref 6.5–8.1)

## 2023-03-16 LAB — LIPASE, BLOOD: Lipase: 26 U/L (ref 11–51)

## 2023-03-16 LAB — POC URINE PREG, ED: Preg Test, Ur: NEGATIVE

## 2023-03-16 MED ORDER — POTASSIUM CHLORIDE CRYS ER 20 MEQ PO TBCR
40.0000 meq | EXTENDED_RELEASE_TABLET | Freq: Once | ORAL | Status: AC
  Administered 2023-03-16: 40 meq via ORAL
  Filled 2023-03-16: qty 2

## 2023-03-16 NOTE — ED Provider Notes (Signed)
[place] EMERGENCY DEPARTMENT AT [facility] Provider Note   CSN: [id number] Arrival date & time: 03/16/23  2101     History  Chief Complaint  Patient presents with   Abdominal Pain    Helen Sims is a 22 y.o. female status post cholecystectomy presented with suprapubic abdominal pain that began yesterday.  Patient is that she was playing with her child outside when she began having a sharp pain in her suprapubic region.  Patient notes that she still has her appendix and yesterday was having episodes of nausea without emesis and is no longer feeling nauseous.  Patient states since then pain has been intermediate and patient does not know what is causing her pain.  Patient not tried any pain meds at this time.  Patient notes that she is due for her menstrual cycle in the next few days and at this time denies any vaginal bleeding, vaginal discharge, hematuria, dysuria.  Patient states that she has been able to have bowel movements without issue as well.  Patient has any fevers, chest pain, shortness of breath, changes in sensation/motor skills, new onset weakness.  Patient also notes that her left arm goes numb periodically with tingling however resolves after a minute or so.  Patient is unsure what is causing this as it is only begun in the past few hours.  Patient denies any recent trauma to her left arm and denies any chest pain, shortness of breath, arm swelling, weakness with this numbness.  Home Medications Prior to Admission medications   Medication Sig Start Date End Date Taking? Authorizing Provider  topiramate (TOPAMAX) 25 MG tablet Take 1 tablet by mouth daily. For migraine    [provider]      Allergies    Patient has no known allergies.    Review of Systems   Review of Systems  Gastrointestinal:  Positive for abdominal pain.    Physical Exam Updated Vital Signs BP 134/89   Pulse 99   Temp 98.8 F (37.1 C) (Oral)   Resp 18   Ht 5\' 3"  (1.6 m)    Wt 111.1 kg   SpO2 97%   BMI 43.40 kg/m  Physical Exam Vitals reviewed.  Constitutional:      General: She is not in acute distress. HENT:     Head: Normocephalic and atraumatic.  Eyes:     Extraocular Movements: Extraocular movements intact.     Conjunctiva/sclera: Conjunctivae normal.     Pupils: Pupils are equal, round, and reactive to light.  Cardiovascular:     Rate and Rhythm: Normal rate and regular rhythm.     Pulses: Normal pulses.     Heart sounds: Normal heart sounds.     Comments: 2+ bilateral radial/dorsalis pedis pulses with regular rate Pulmonary:     Effort: Pulmonary effort is normal. No respiratory distress.     Breath sounds: Normal breath sounds.  Abdominal:     Palpations: Abdomen is soft.     Tenderness: There is abdominal tenderness (Suprapubic). There is no guarding or rebound.  Musculoskeletal:        General: Normal range of motion.     Cervical back: Normal range of motion and neck supple.     Comments: 5 out of 5 bilateral grip/leg extension strength  Skin:    General: Skin is warm and dry.     Capillary Refill: Capillary refill takes less than 2 seconds.  Neurological:     General: No focal deficit present.  Mental Status: She is alert and oriented to person, place, and time.     Comments: Sensation intact in all 4 limbs  Psychiatric:        Mood and Affect: Mood normal.     ED Results / Procedures / Treatments   Labs (all labs ordered are listed, but only abnormal results are displayed) Labs Reviewed  CBC WITH DIFFERENTIAL/PLATELET - Abnormal; Notable for the following components:      Result Value   WBC 10.8 (*)    All other components within normal limits  COMPREHENSIVE METABOLIC PANEL - Abnormal; Notable for the following components:   Potassium 3.0 (*)    CO2 20 (*)    Calcium 8.6 (*)    AST 13 (*)    All other components within normal limits  URINALYSIS, ROUTINE W REFLEX MICROSCOPIC - Abnormal; Notable for the following  components:   APPearance HAZY (*)    Hgb urine dipstick MODERATE (*)    Protein, ur 30 (*)    Leukocytes,Ua MODERATE (*)    Bacteria, UA MANY (*)    All other components within normal limits  LIPASE, BLOOD  POC URINE PREG, ED    EKG None  Radiology No results found.  Procedures Procedures    Medications Ordered in ED Medications  potassium chloride SA (KLOR-CON M) CR tablet 40 mEq (40 mEq Oral Given 03/16/23 2238)    ED Course/ Medical Decision Making/ A&P                             Medical Decision Making Amount and/or Complexity of Data Reviewed Labs: ordered.   [name] 22 y.o. presented today for lower abdominal pain and right arm paresthesias. Working DDx that I considered at this time includes, but not limited to, gastroenteritis, colitis, small bowel obstruction, appendicitis, cholecystitis, pancreatitis, nephrolithiasis, AAA, UTI, pyelonephritis, ruptured ectopic pregnancy, PID, ovarian torsion, peripheral neuropathy, electrolyte abnormalities.  R/o DDx: gastroenteritis, colitis, small bowel obstruction, appendicitis, cholecystitis, pancreatitis, nephrolithiasis, AAA, UTI, pyelonephritis, ruptured ectopic pregnancy, PID, ovarian torsion: These are considered less likely due to history of present illness and physical exam findings.  Review of prior external notes: 04/27/2022 ED  Unique Tests and My Interpretation:  CBC with differential: Unremarkable CMP: Hypokalemia 3.0 Lipase: Negative UA: Moderate leukocytes, squamous epithelial cells present Urine Pregnancy: Negative  Discussion with Independent Historian: None  Discussion of Management of Tests: None  Risk: Low: based on diagnostic testing/clinical impression and treatment plan  Risk Stratification Score: None  Plan: On exam patient was in no acute distress with stable vitals.  Patient was resting comfortably when I entered the room.  Patient was tender in the suprapubic region without  peritoneal signs.  Patient stated that she has having left arm numbness however during the encounter she was not actively having any paresthesias and had equal 5 out of 5 bilateral grip strength with sensation intact in both hands and equal as well.  At this time I suspect patient's left arm numbness is a peripheral neuropathy and do not suspect CVA, ACS or other life-threatening diagnoses as patient is not endorsing any chest pain, shortness of breath, neck pain, weakness.  Abdominal labs will be drawn and patient will be given Tylenol for pain management.  Patient stable at this time.  Patient's labs came back reassuring.  Patient's urine does appear to be a dirty sample but does not show any nitrates indicative of a UTI patient  does not endorsing any dysuria and so will withhold antibiotics at this time.  I spoke to the patient about the possibility of doing a CT scan to further evaluate for possible appendicitis.  Patient stated that she felt that she did not need a CT scan and is comfortable being discharged with outpatient follow-up.  I spoke to the patient about the risks of not doing a CT scan including missed diagnosis, complications, worsening of symptoms and patient with full decision-making capacity stated that she understood the risks and stated that she did not want to do a CT scan at this time.  Patient will be discharged with outpatient follow-up.  I encouraged patient to use Tylenol or ibuprofen every 6 hours as needed for pain.  I spoke to patient's left arm paresthesias that come and go is related to her hypokalemia and so patient was given potassium as her potassium was slightly low today.  In terms of patient's abdominal pain this may be related to her menstrual cycle as she she is to start her cycle in a few days versus gas as the patient thinks that her abdominal pain may be gas related.  Patient was given return precautions. Patient stable for discharge at this time.  Patient verbalized  understanding of plan.         Final Clinical Impression(s) / ED Diagnoses Final diagnoses:  Hypokalemia  Lower abdominal pain    Rx / DC Orders ED Discharge Orders     None         Remi Deter 03/16/23 2309    Jacalyn Lefevre, MD 03/17/23 1530

## 2023-03-16 NOTE — ED Notes (Signed)
ED Provider at bedside. 

## 2023-03-16 NOTE — ED Triage Notes (Signed)
Pt here from home with c/o lower abd that started yesterday, pt says pain is intermittent. Pt reports last BM was an hour ago and normal. Pt also reports left arm  numbness and tingling for the past few hours.

## 2023-03-16 NOTE — Discharge Instructions (Signed)
Please follow-up with your primary care provider regarding recent symptoms and ER visit.  Today your labs are reassuring and as we discussed we decided to forego CT scan at this time.  You may take Tylenol or ibuprofen every 6 hours as needed for pain.  If symptoms change or worsen please return to ER.

## 2023-03-28 DIAGNOSIS — G43909 Migraine, unspecified, not intractable, without status migrainosus: Secondary | ICD-10-CM | POA: Diagnosis not present

## 2023-05-15 DIAGNOSIS — H5213 Myopia, bilateral: Secondary | ICD-10-CM | POA: Diagnosis not present

## 2023-05-15 DIAGNOSIS — H5203 Hypermetropia, bilateral: Secondary | ICD-10-CM | POA: Diagnosis not present

## 2024-03-30 DIAGNOSIS — F411 Generalized anxiety disorder: Secondary | ICD-10-CM | POA: Diagnosis not present

## 2024-03-30 DIAGNOSIS — F322 Major depressive disorder, single episode, severe without psychotic features: Secondary | ICD-10-CM | POA: Diagnosis not present

## 2024-05-01 DIAGNOSIS — Z30011 Encounter for initial prescription of contraceptive pills: Secondary | ICD-10-CM | POA: Diagnosis not present

## 2024-05-01 DIAGNOSIS — F322 Major depressive disorder, single episode, severe without psychotic features: Secondary | ICD-10-CM | POA: Diagnosis not present

## 2024-05-01 DIAGNOSIS — F411 Generalized anxiety disorder: Secondary | ICD-10-CM | POA: Diagnosis not present

## 2024-05-25 ENCOUNTER — Ambulatory Visit
Admission: RE | Admit: 2024-05-25 | Discharge: 2024-05-25 | Disposition: A | Discharge status: Home / Self Care | Payer: Self-pay | Source: Ambulatory Visit

## 2024-05-25 VITALS — BP 117/87 | HR 78 | Temp 98.2°F | Resp 20

## 2024-05-25 DIAGNOSIS — J069 Acute upper respiratory infection, unspecified: Secondary | ICD-10-CM | POA: Diagnosis not present

## 2024-05-25 LAB — POC SOFIA SARS ANTIGEN FIA: SARS Coronavirus 2 Ag: NEGATIVE

## 2024-05-25 MED ORDER — PROMETHAZINE-DM 6.25-15 MG/5ML PO SYRP: 5.0000 mL | ORAL_SOLUTION | Freq: Every evening | ORAL | 0 refills | Status: AC | PRN

## 2024-05-25 NOTE — ED Provider Notes (Signed)
 [facility]    CSN: [id number] Arrival date & time: 05/25/24  1156      History   Chief Complaint Chief Complaint  Patient presents with   Ear Drainage    Entered by patient    HPI Helen Sims is a 23 y.o. female.   Helen Sims is a 23 y.o. female presenting for chief complaint of bilateral ear pain, cough, congestion, sore throat, and generalized fatigue that started 3 days ago on Friday May 22, 2024 (today is day 3 of symptoms).  Cough is minimally productive. Denies history of asthma/chronic respiratory problems.  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  Denies n/v/d, abdominal pain, rashes, and fevers/chills. No recent sick contacts with similar symptoms.  Taking robitussin and allergy medications with some temporary relief.    Ear Drainage    Past Medical History:  Diagnosis Date   ADHD (attention deficit hyperactivity disorder)    Insomnia     Patient Active Problem List   Diagnosis Date Noted   Calculus of gallbladder without cholecystitis without obstruction 09/07/2019   History of gestational hypertension 08/27/2019    Past Surgical History:  Procedure Laterality Date   CHOLECYSTECTOMY N/A 09/21/2019   Procedure: LAPAROSCOPIC CHOLECYSTECTOMY;  Surgeon: Kallie Manuelita BROCKS, MD;  Location: [facility];  Service: General;  Laterality: N/A;   tubes in ears     VAGINAL DELIVERY      OB History     Gravida  1   Para  1   Term  1   Preterm      AB      Living  1      SAB      IAB      Ectopic      Multiple  0   Live Births  1            Home Medications    Prior to Admission medications   Medication Sig Start Date End Date Taking? Authorizing Provider  SPRINTEC 28 0.25-35 MG-MCG tablet Take 1 tablet by mouth daily. 05/01/24  Yes [provider]  FLUoxetine (PROZAC) 10 MG capsule Take 10 mg by mouth daily.    [provider]  hydrOXYzine (ATARAX) 25 MG tablet Take 25 mg by mouth 3  (three) times daily as needed.    [provider]  topiramate (TOPAMAX) 25 MG tablet Take 1 tablet by mouth daily. For migraine    [provider]    Family History Family History  Problem Relation Age of Onset   Hypertension Father    Hypertension Mother     Social History Social History   Tobacco Use   Smoking status: Never   Smokeless tobacco: Never  Vaping Use   Vaping status: Never Used  Substance Use Topics   Alcohol use: No   Drug use: No     Allergies   Patient has no known allergies.   Review of Systems Review of Systems Per HPI  Physical Exam Triage Vital Signs ED Triage Vitals  Encounter Vitals Group     BP 05/25/24 1245 117/87     Girls Systolic BP Percentile --      Girls Diastolic BP Percentile --      Boys Systolic BP Percentile --      Boys Diastolic BP Percentile --      Pulse Rate 05/25/24 1245 78     Resp 05/25/24 1245 20     Temp 05/25/24 1245 98.2 F (  36.8 C)     Temp Source 05/25/24 1245 Oral     SpO2 05/25/24 1245 96 %     Weight --      Height --      Head Circumference --      Peak Flow --      Pain Score 05/25/24 1248 4     Pain Loc --      Pain Education --      Exclude from Growth Chart --    No data found.  Updated Vital Signs BP 117/87 (BP Location: Right Arm)   Pulse 78   Temp 98.2 F (36.8 C) (Oral)   Resp 20   LMP 05/19/2024 (Exact Date)   SpO2 96%   Visual Acuity Right Eye Distance:   Left Eye Distance:   Bilateral Distance:    Right Eye Near:   Left Eye Near:    Bilateral Near:     Physical Exam Vitals and nursing note reviewed.  Constitutional:      Appearance: She is not ill-appearing or toxic-appearing.  HENT:     Head: Normocephalic and atraumatic.     Right Ear: Hearing, ear canal and external ear normal. Tympanic membrane is erythematous.     Left Ear: Hearing, ear canal and external ear normal. Tympanic membrane is erythematous.     Nose: Nose normal.     Mouth/Throat:      Lips: Pink.     Mouth: Mucous membranes are moist. No injury or oral lesions.     Dentition: Normal dentition.     Tongue: No lesions.     Pharynx: Oropharynx is clear. Uvula midline. No pharyngeal swelling, oropharyngeal exudate, posterior oropharyngeal erythema, uvula swelling or postnasal drip.     Tonsils: No tonsillar exudate.  Eyes:     General: Lids are normal. Vision grossly intact. Gaze aligned appropriately.     Extraocular Movements: Extraocular movements intact.     Conjunctiva/sclera: Conjunctivae normal.  Neck:     Trachea: Trachea and phonation normal.  Cardiovascular:     Rate and Rhythm: Normal rate and regular rhythm.     Heart sounds: Normal heart sounds, S1 normal and S2 normal.  Pulmonary:     Effort: Pulmonary effort is normal. No respiratory distress.     Breath sounds: Normal breath sounds and air entry.  Musculoskeletal:     Cervical back: Normal range of motion and neck supple.  Lymphadenopathy:     Cervical: No cervical adenopathy.  Skin:    General: Skin is warm and dry.     Capillary Refill: Capillary refill takes less than 2 seconds.     Findings: No rash.  Neurological:     General: No focal deficit present.     Mental Status: She is alert and oriented to person, place, and time. Mental status is at baseline.     Cranial Nerves: No dysarthria or facial asymmetry.  Psychiatric:        Mood and Affect: Mood normal.        Speech: Speech normal.        Behavior: Behavior normal.        Thought Content: Thought content normal.        Judgment: Judgment normal.      UC Treatments / Results  Labs (all labs ordered are listed, but only abnormal results are displayed) Labs Reviewed  POC SOFIA SARS ANTIGEN FIA    EKG   Radiology No results found.  Procedures Procedures (  including critical care time)  Medications Ordered in UC Medications - No data to display  Initial Impression / Assessment and Plan / UC Course  I have reviewed the  triage vital signs and the nursing notes.  Pertinent labs & imaging results that were available during my care of the patient were reviewed by me and considered in my medical decision making (see chart for details).   1. Viral URI with cough Suspect viral URI, viral syndrome.  Strep/viral testing: POC COVID-19 negative.   Physical exam findings reassuring, vital signs hemodynamically stable, and lungs clear, therefore deferred imaging of the chest.  Advised supportive care/prescriptions for symptomatic relief as outlined in AVS.    Counseled patient on potential for adverse effects with medications prescribed/recommended today, strict ER and return-to-clinic precautions discussed, patient verbalized understanding.    Final Clinical Impressions(s) / UC Diagnoses   Final diagnoses:  Viral URI with cough     Discharge Instructions      You have a viral illness which will improve on its own with rest, fluids, and medications to help with your symptoms.  Tylenol , guaifenesin (plain mucinex), and saline nasal sprays may help relieve symptoms.   Two teaspoons of honey in 1 cup of warm water every 4-6 hours may help with throat pains.  Humidifier in room at nighttime may help soothe cough (clean well daily).   Take Promethazine  DM cough medication to help with your cough at nighttime so that you are able to sleep. Do not drive, drink alcohol, or go to work while taking this medication since it can make you sleepy. Only take this at nighttime.   For chest pain, shortness of breath, inability to keep food or fluids down without vomiting, fever that does not respond to tylenol  or motrin , or any other severe symptoms, please go to the ER for further evaluation. Return to urgent care as needed, otherwise follow-up with PCP.       ED Prescriptions   None    PDMP not reviewed this encounter.   Enedelia Dorna HERO, [place] 05/25/24 1326

## 2024-05-25 NOTE — ED Triage Notes (Signed)
 Pt reports pain in the right ear cough nasal congestion sinus drainage x 3 days, pt has tried OTC meds hasn't found relief.

## 2024-05-25 NOTE — Discharge Instructions (Addendum)
